# Patient Record
Sex: Female | Born: 1985 | Race: Black or African American | Hispanic: No | Marital: Single | Smoking: Former smoker
Health system: Southern US, Community
[De-identification: ages and names within clinical notes are randomized; demographics above are authoritative.]

## PROBLEM LIST (undated history)

## (undated) DIAGNOSIS — D649 Anemia, unspecified: Secondary | ICD-10-CM

## (undated) DIAGNOSIS — K449 Diaphragmatic hernia without obstruction or gangrene: Secondary | ICD-10-CM

## (undated) DIAGNOSIS — G43909 Migraine, unspecified, not intractable, without status migrainosus: Secondary | ICD-10-CM

## (undated) DIAGNOSIS — F419 Anxiety disorder, unspecified: Secondary | ICD-10-CM

## (undated) HISTORY — PX: EYE SURGERY: SHX253

---

## 2015-05-30 ENCOUNTER — Ambulatory Visit (INDEPENDENT_AMBULATORY_CARE_PROVIDER_SITE_OTHER): Payer: Medicaid Other | Admitting: Licensed Clinical Social Worker

## 2015-05-30 DIAGNOSIS — F39 Unspecified mood [affective] disorder: Secondary | ICD-10-CM
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
## 2015-06-13 ENCOUNTER — Ambulatory Visit: Payer: Medicaid Other | Admitting: Licensed Clinical Social Worker

## 2015-06-17 ENCOUNTER — Ambulatory Visit: Payer: Medicaid Other | Admitting: Licensed Clinical Social Worker

## 2016-01-28 ENCOUNTER — Encounter (HOSPITAL_COMMUNITY): Payer: Self-pay

## 2016-01-28 ENCOUNTER — Emergency Department (HOSPITAL_COMMUNITY)
Admission: EM | Admit: 2016-01-28 | Discharge: 2016-01-29 | Disposition: A | Discharge status: Home / Self Care | Payer: Commercial Managed Care - PPO | Attending: Emergency Medicine | Admitting: Emergency Medicine

## 2016-01-28 DIAGNOSIS — R1011 Right upper quadrant pain: Secondary | ICD-10-CM | POA: Diagnosis not present

## 2016-01-28 DIAGNOSIS — K2970 Gastritis, unspecified, without bleeding: Secondary | ICD-10-CM

## 2016-01-28 DIAGNOSIS — R042 Hemoptysis: Secondary | ICD-10-CM | POA: Diagnosis present

## 2016-01-28 NOTE — ED Notes (Signed)
Pt states she was coughing up small amounts of blood earlier after having an endoscopy, that has stopped but she's complaining of sharp epigastric pain

## 2016-01-29 ENCOUNTER — Emergency Department (HOSPITAL_COMMUNITY): Payer: Commercial Managed Care - PPO

## 2016-01-29 DIAGNOSIS — K2970 Gastritis, unspecified, without bleeding: Secondary | ICD-10-CM | POA: Diagnosis not present

## 2016-01-29 LAB — COMPREHENSIVE METABOLIC PANEL
ALK PHOS: 38 U/L (ref 38–126)
ALT: 16 U/L (ref 14–54)
AST: 16 U/L (ref 15–41)
Albumin: 4.7 g/dL (ref 3.5–5.0)
Anion gap: 5 (ref 5–15)
BUN: 6 mg/dL (ref 6–20)
CALCIUM: 8.8 mg/dL — AB (ref 8.9–10.3)
CO2: 22 mmol/L (ref 22–32)
CREATININE: 0.82 mg/dL (ref 0.44–1.00)
Chloride: 111 mmol/L (ref 101–111)
Glucose, Bld: 88 mg/dL (ref 65–99)
Potassium: 3.7 mmol/L (ref 3.5–5.1)
SODIUM: 138 mmol/L (ref 135–145)
Total Bilirubin: 0.8 mg/dL (ref 0.3–1.2)
Total Protein: 7.8 g/dL (ref 6.5–8.1)

## 2016-01-29 LAB — CBC WITH DIFFERENTIAL/PLATELET
BASOS ABS: 0 10*3/uL (ref 0.0–0.1)
Basophils Relative: 0 %
Eosinophils Absolute: 0.1 10*3/uL (ref 0.0–0.7)
Eosinophils Relative: 3 %
HCT: 37.1 % (ref 36.0–46.0)
HEMOGLOBIN: 12.7 g/dL (ref 12.0–15.0)
LYMPHS ABS: 2.9 10*3/uL (ref 0.7–4.0)
LYMPHS PCT: 52 %
MCH: 31.5 pg (ref 26.0–34.0)
MCHC: 34.2 g/dL (ref 30.0–36.0)
MCV: 92.1 fL (ref 78.0–100.0)
Monocytes Absolute: 0.2 10*3/uL (ref 0.1–1.0)
Monocytes Relative: 4 %
NEUTROS PCT: 41 %
Neutro Abs: 2.3 10*3/uL (ref 1.7–7.7)
Platelets: 264 10*3/uL (ref 150–400)
RBC: 4.03 MIL/uL (ref 3.87–5.11)
RDW: 12.2 % (ref 11.5–15.5)
WBC: 5.6 10*3/uL (ref 4.0–10.5)

## 2016-01-29 LAB — I-STAT BETA HCG BLOOD, ED (MC, WL, AP ONLY)

## 2016-01-29 LAB — RAPID URINE DRUG SCREEN, HOSP PERFORMED
Amphetamines: NOT DETECTED
BARBITURATES: NOT DETECTED
BENZODIAZEPINES: NOT DETECTED
COCAINE: NOT DETECTED
Opiates: NOT DETECTED
TETRAHYDROCANNABINOL: NOT DETECTED

## 2016-01-29 LAB — LIPASE, BLOOD: Lipase: 32 U/L (ref 11–51)

## 2016-01-29 MED ORDER — MORPHINE SULFATE (PF) 4 MG/ML IV SOLN
4.0000 mg | Freq: Once | INTRAVENOUS | Status: DC
  Filled 2016-01-29: qty 1

## 2016-01-29 MED ORDER — PANTOPRAZOLE SODIUM 40 MG PO TBEC
40.0000 mg | DELAYED_RELEASE_TABLET | Freq: Once | ORAL | Status: AC
  Administered 2016-01-29: 40 mg via ORAL
  Filled 2016-01-29: qty 1

## 2016-01-29 MED ORDER — ONDANSETRON 4 MG PO TBDP: 4.0000 mg | ORAL_TABLET | Freq: Three times a day (TID) | ORAL | Status: DC | PRN

## 2016-01-29 MED ORDER — ONDANSETRON HCL 4 MG/2ML IJ SOLN
4.0000 mg | Freq: Once | INTRAMUSCULAR | Status: AC
  Administered 2016-01-29: 4 mg via INTRAVENOUS
  Filled 2016-01-29: qty 2

## 2016-01-29 MED ORDER — SUCRALFATE 1 GM/10ML PO SUSP: 1.0000 g | Freq: Three times a day (TID) | ORAL | Status: DC

## 2016-01-29 MED ORDER — PROMETHAZINE HCL 25 MG PO TABS: 25.0000 mg | ORAL_TABLET | Freq: Four times a day (QID) | ORAL | Status: DC | PRN

## 2016-01-29 MED ORDER — HYDROCODONE-ACETAMINOPHEN 5-325 MG PO TABS: 1.0000 | ORAL_TABLET | ORAL | Status: DC | PRN

## 2016-01-29 MED ORDER — SODIUM CHLORIDE 0.9 % IV BOLUS (SEPSIS)
1000.0000 mL | Freq: Once | INTRAVENOUS | Status: AC
  Administered 2016-01-29: 1000 mL via INTRAVENOUS

## 2016-01-29 MED ORDER — GI COCKTAIL ~~LOC~~
30.0000 mL | Freq: Once | ORAL | Status: AC
  Administered 2016-01-29: 30 mL via ORAL
  Filled 2016-01-29: qty 30

## 2016-01-29 NOTE — Discharge Instructions (Signed)
Please avoid NSAIDs such as aspirin, goody powders, Ibuprofen, Advil, Motrin, Aleve, Excedrin as these can worsen gastritis.  Tylenol is safe to take.  Please avoid alcohol, spicy, acidic, greasy, fatty meals.  Please follow up with your GI doctor.  Please take your Omeprazole as prescribed.   Gastritis, Adult Gastritis is soreness and swelling (inflammation) of the lining of the stomach. Gastritis can develop as a sudden onset (acute) or long-term (chronic) condition. If gastritis is not treated, it can lead to stomach bleeding and ulcers. CAUSES  Gastritis occurs when the stomach lining is weak or damaged. Digestive juices from the stomach then inflame the weakened stomach lining. The stomach lining may be weak or damaged due to viral or bacterial infections. One common bacterial infection is the Helicobacter pylori infection. Gastritis can also result from excessive alcohol consumption, taking certain medicines, or having too much acid in the stomach.  SYMPTOMS  In some cases, there are no symptoms. When symptoms are present, they may include:  Pain or a burning sensation in the upper abdomen.  Nausea.  Vomiting.  An uncomfortable feeling of fullness after eating. DIAGNOSIS  Your caregiver may suspect you have gastritis based on your symptoms and a physical exam. To determine the cause of your gastritis, your caregiver may perform the following:  Blood or stool tests to check for the H pylori bacterium.  Gastroscopy. A thin, flexible tube (endoscope) is passed down the esophagus and into the stomach. The endoscope has a light and camera on the end. Your caregiver uses the endoscope to view the inside of the stomach.  Taking a tissue sample (biopsy) from the stomach to examine under a microscope. TREATMENT  Depending on the cause of your gastritis, medicines may be prescribed. If you have a bacterial infection, such as an H pylori infection, antibiotics may be given. If your gastritis is  caused by too much acid in the stomach, H2 blockers or antacids may be given. Your caregiver may recommend that you stop taking aspirin, ibuprofen, or other nonsteroidal anti-inflammatory drugs (NSAIDs). HOME CARE INSTRUCTIONS  Only take over-the-counter or prescription medicines as directed by your caregiver.  If you were given antibiotic medicines, take them as directed. Finish them even if you start to feel better.  Drink enough fluids to keep your urine clear or pale yellow.  Avoid foods and drinks that make your symptoms worse, such as:  Caffeine or alcoholic drinks.  Chocolate.  Peppermint or mint flavorings.  Garlic and onions.  Spicy foods.  Citrus fruits, such as oranges, lemons, or limes.  Tomato-based foods such as sauce, chili, salsa, and pizza.  Fried and fatty foods.  Eat small, frequent meals instead of large meals. SEEK IMMEDIATE MEDICAL CARE IF:   You have black or dark red stools.  You vomit blood or material that looks like coffee grounds.  You are unable to keep fluids down.  Your abdominal pain gets worse.  You have a fever.  You do not feel better after 1 week.  You have any other questions or concerns. MAKE SURE YOU:  Understand these instructions.  Will watch your condition.  Will get help right away if you are not doing well or get worse.   This information is not intended to replace advice given to you by your health care provider. Make sure you discuss any questions you have with your health care provider.   Document Released: 07/14/2001 Document Revised: 01/19/2012 Document Reviewed: 09/02/2011 Elsevier Interactive Patient Education [employer]2016 Elsevier Inc.  Food Choices for Gastroesophageal Reflux Disease, Adult When you have gastroesophageal reflux disease (GERD), the foods you eat and your eating habits are very important. Choosing the right foods can help ease the discomfort of GERD. WHAT GENERAL GUIDELINES DO I NEED TO  FOLLOW?  Choose fruits, vegetables, whole grains, low-fat dairy products, and low-fat meat, fish, and poultry.  Limit fats such as oils, salad dressings, butter, nuts, and avocado.  Keep a food diary to identify foods that cause symptoms.  Avoid foods that cause reflux. These may be different for different people.  Eat frequent small meals instead of three large meals each day.  Eat your meals slowly, in a relaxed setting.  Limit fried foods.  Cook foods using methods other than frying.  Avoid drinking alcohol.  Avoid drinking large amounts of liquids with your meals.  Avoid bending over or lying down until 2-3 hours after eating. WHAT FOODS ARE NOT RECOMMENDED? The following are some foods and drinks that may worsen your symptoms: Vegetables Tomatoes. Tomato juice. Tomato and spaghetti sauce. Chili peppers. Onion and garlic. Horseradish. Fruits Oranges, grapefruit, and lemon (fruit and juice). Meats High-fat meats, fish, and poultry. This includes hot dogs, ribs, ham, sausage, salami, and bacon. Dairy Whole milk and chocolate milk. Sour cream. Cream. Butter. Ice cream. Cream cheese.  Beverages Coffee and tea, with or without caffeine. Carbonated beverages or energy drinks. Condiments Hot sauce. Barbecue sauce.  Sweets/Desserts Chocolate and cocoa. Donuts. Peppermint and spearmint. Fats and Oils High-fat foods, including [place] fries and potato chips. Other Vinegar. Strong spices, such as black pepper, white pepper, red pepper, cayenne, curry powder, cloves, ginger, and chili powder. The items listed above may not be a complete list of foods and beverages to avoid. Contact your dietitian for more information.   This information is not intended to replace advice given to you by your health care provider. Make sure you discuss any questions you have with your health care provider.   Document Released: 07/20/2005 Document Revised: 08/10/2014 Document Reviewed:  05/24/2013 Elsevier Interactive Patient Education [employer]2016 Elsevier Inc.

## 2016-01-29 NOTE — ED Provider Notes (Signed)
By signing my name below, I, Vista Minkobert Ross, attest that this documentation has been prepared under the direction and in the presence of Teyana Pierron N Lewellyn Fultz, DO. Electronically signed, Vista Minkobert Ross, ED Scribe. 01/29/2016. 4:10 AM.  TIME SEEN: 1:08am  CHIEF COMPLAINT: Hemoptysis  HPI: HPI Comments: Ivar DrapeLatoya Pacey is a 30 y.o. female with no significant past medical history who presents to the Emergency Department complaining of intermittent, sharp epigastric pain that started earlier today after an endoscopy procedure. Pt [facility] for an [facility] by Dr. Rocky CraftsJu. Pt reports that she was told that "her intestines were abnormally high in her abdomen" and found a hernia. Pt also complains of a dry cough with streaky blood in the mucous that has resolved. Pt states her pain was exacerbated at home while sitting and watching TV. Pt states she had mild epigastric pain before the endoscopy procedure but has become much worse. Pt also reports intermittent mild nausea for the past month which is why she had the endoscopy. Pt's boyfriend states he called Dr. Gabriel RainwaterJu's after hour number but was unable to reach anyone. Pt denies any Hx of gallbladder problems. Pt denies any chest pain, diarrhea, shortness of breath. No bloody stools or melena. Not on anticoagulation.  ROS: See HPI Constitutional: no fever  Eyes: no drainage  ENT: no runny nose   Cardiovascular:  no chest pain  Resp: no SOB  GI: no vomiting GU: no dysuria Integumentary: no rash  Allergy: no hives  Musculoskeletal: no leg swelling  Neurological: no slurred speech ROS otherwise negative  PAST MEDICAL HISTORY/PAST SURGICAL HISTORY:  History reviewed. No pertinent past medical history.  MEDICATIONS:  Prior to Admission medications   Not on File    ALLERGIES:  Allergies not on file  SOCIAL HISTORY:  Social History  Substance Use Topics  . Smoking status: Never Smoker   . Smokeless tobacco: Not on file  .  Alcohol Use: No    FAMILY HISTORY: History reviewed. No pertinent family history.  EXAM: BP 120/84 mmHg  Pulse 73  Temp(Src) 98.4 F (36.9 C) (Oral)  Resp 16  SpO2 100%  LMP 01/02/2016 CONSTITUTIONAL: Alert and oriented and responds appropriately to questions. Well-appearing; well-nourished, Pt is very anxious HEAD: Normocephalic EYES: Conjunctivae clear, PERRL ENT: normal nose; no rhinorrhea; moist mucous membranes NECK: Supple, no meningismus, no LAD  CARD: RRR; S1 and S2 appreciated; no murmurs, no clicks, no rubs, no gallops RESP: Normal chest excursion without splinting or tachypnea; breath sounds clear and equal bilaterally; no wheezes, no rhonchi, no rales, no hypoxia or respiratory distress, speaking full sentences ABD/GI: Normal bowel sounds; non-distended; soft, tender to palpation through epigastric region and right upper quadrant, no rebound, voluntary guarding, positive murphys sign, no peritoneal sign BACK:  The back appears normal and is non-tender to palpation, there is no CVA tenderness EXT: Normal ROM in all joints; non-tender to palpation; no edema; normal capillary refill; no cyanosis, no calf tenderness or swelling    SKIN: Normal color for age and race; warm; no rash NEURO: Moves all extremities equally, sensation to light touch intact diffusely, cranial nerves II through XII intact PSYCH: The patient's mood and manner are appropriate. Grooming and personal hygiene are appropriate.  MEDICAL DECISION MAKING: Patient here with epigastric pain that she had prior to endoscopy yesterday but has worsened. Abdomen is non-peritoneal. Does have a right upper quadrant tenderness as well with a positive Murphy sign. States she has never been assessed for gallbladder  disease. Will obtain acute abdominal series to evaluate for possible perforation although I feel it is less likely given her exam. Will also obtain right upper quadrant ultrasound. States she was coughing up small  amounts of bloody mucus earlier but this has resolved. No chest pain or shortness of breath. Lungs are clear to auscultation. She is hemostatically stable. Afebrile and nontoxic.  ED PROGRESS: Patient's labs are unremarkable. LFTs, lipase are normal. No leukocytosis. X-ray of her chest and abdomen are also normal. Right upper quadrant ultrasound shows no cholelithiasis, choledocholithiasis or cholecystitis. Patient given GI cocktail, Protonix and Zofran without relief. Have offered her narcotics. We'll discuss with her gastroenterologist on call.   Discussed patient's case with Dr. Rocky CraftsJu.  She states patient had gastritis on her endoscopy and a hiatal hernia. She was prescribed omeprazole. No other acute findings. Agrees with our workup. Will see the patient in her office as scheduled. Patient refusing narcotics. States she once to go home. I do not feel she needs further emergent workup. We'll discharge with Carafate, Zofran, Vicodin. Discussed return precautions. She verbalized understanding and is Trouble with this plan.    Patient now stating the Zofran does not help with her nausea. Asking for something else. We'll discharge with Phenergan. She is still very well-appearing. No vomiting. No hemoptysis. No hypoxia.    At this time, I do not feel there is any life-threatening condition present. I have reviewed and discussed all results (EKG, imaging, lab, urine as appropriate), exam findings with patient. I have reviewed nursing notes and appropriate previous records.  I feel the patient is safe to be discharged home without further emergent workup. Discussed usual and customary return precautions. Patient and family (if present) verbalize understanding and are comfortable with this plan.  Patient will follow-up with their primary care provider. If they do not have a primary care provider, information for follow-up has been provided to them. All questions have been answered.    This chart was scribed  in my presence and reviewed by me personally.   Layla MawKristen N Naftali Carchi, DO 01/29/16 [phone]

## 2016-01-29 NOTE — ED Notes (Signed)
Patient transported to X-ray 

## 2016-04-14 ENCOUNTER — Ambulatory Visit: Payer: Self-pay

## 2016-07-12 ENCOUNTER — Emergency Department (HOSPITAL_BASED_OUTPATIENT_CLINIC_OR_DEPARTMENT_OTHER): Payer: Medicaid Other

## 2016-07-12 ENCOUNTER — Emergency Department (HOSPITAL_BASED_OUTPATIENT_CLINIC_OR_DEPARTMENT_OTHER)
Admission: EM | Admit: 2016-07-12 | Discharge: 2016-07-13 | Disposition: A | Discharge status: Home / Self Care | Payer: Medicaid Other | Attending: Emergency Medicine | Admitting: Emergency Medicine

## 2016-07-12 ENCOUNTER — Encounter (HOSPITAL_BASED_OUTPATIENT_CLINIC_OR_DEPARTMENT_OTHER): Payer: Self-pay | Admitting: *Deleted

## 2016-07-12 DIAGNOSIS — A084 Viral intestinal infection, unspecified: Secondary | ICD-10-CM | POA: Diagnosis not present

## 2016-07-12 DIAGNOSIS — R509 Fever, unspecified: Secondary | ICD-10-CM | POA: Diagnosis present

## 2016-07-12 DIAGNOSIS — Z91040 Latex allergy status: Secondary | ICD-10-CM | POA: Insufficient documentation

## 2016-07-12 DIAGNOSIS — F17200 Nicotine dependence, unspecified, uncomplicated: Secondary | ICD-10-CM | POA: Insufficient documentation

## 2016-07-12 HISTORY — DX: Diaphragmatic hernia without obstruction or gangrene: K44.9

## 2016-07-12 HISTORY — DX: Anemia, unspecified: D64.9

## 2016-07-12 LAB — URINALYSIS, ROUTINE W REFLEX MICROSCOPIC
Glucose, UA: NEGATIVE mg/dL
HGB URINE DIPSTICK: NEGATIVE
KETONES UR: 15 mg/dL — AB
Leukocytes, UA: NEGATIVE
NITRITE: NEGATIVE
Protein, ur: NEGATIVE mg/dL
SPECIFIC GRAVITY, URINE: 1.028 (ref 1.005–1.030)
pH: 7.5 (ref 5.0–8.0)

## 2016-07-12 LAB — I-STAT CG4 LACTIC ACID, ED: LACTIC ACID, VENOUS: 1.51 mmol/L (ref 0.5–1.9)

## 2016-07-12 LAB — CBC WITH DIFFERENTIAL/PLATELET
BASOS PCT: 0 %
Basophils Absolute: 0 10*3/uL (ref 0.0–0.1)
EOS ABS: 0.2 10*3/uL (ref 0.0–0.7)
Eosinophils Relative: 2 %
HEMATOCRIT: 38.9 % (ref 36.0–46.0)
HEMOGLOBIN: 12.9 g/dL (ref 12.0–15.0)
LYMPHS ABS: 2.2 10*3/uL (ref 0.7–4.0)
Lymphocytes Relative: 31 %
MCH: 31.6 pg (ref 26.0–34.0)
MCHC: 33.2 g/dL (ref 30.0–36.0)
MCV: 95.3 fL (ref 78.0–100.0)
Monocytes Absolute: 0.4 10*3/uL (ref 0.1–1.0)
Monocytes Relative: 5 %
NEUTROS ABS: 4.3 10*3/uL (ref 1.7–7.7)
NEUTROS PCT: 62 %
Platelets: 273 10*3/uL (ref 150–400)
RBC: 4.08 MIL/uL (ref 3.87–5.11)
RDW: 12.3 % (ref 11.5–15.5)
WBC: 7.1 10*3/uL (ref 4.0–10.5)

## 2016-07-12 LAB — COMPREHENSIVE METABOLIC PANEL
ALT: 21 U/L (ref 14–54)
ANION GAP: 8 (ref 5–15)
AST: 22 U/L (ref 15–41)
Albumin: 4.2 g/dL (ref 3.5–5.0)
Alkaline Phosphatase: 45 U/L (ref 38–126)
BUN: 11 mg/dL (ref 6–20)
CALCIUM: 9.5 mg/dL (ref 8.9–10.3)
CHLORIDE: 108 mmol/L (ref 101–111)
CO2: 23 mmol/L (ref 22–32)
CREATININE: 0.89 mg/dL (ref 0.44–1.00)
Glucose, Bld: 97 mg/dL (ref 65–99)
Potassium: 3.5 mmol/L (ref 3.5–5.1)
SODIUM: 139 mmol/L (ref 135–145)
Total Bilirubin: 1 mg/dL (ref 0.3–1.2)
Total Protein: 8.3 g/dL — ABNORMAL HIGH (ref 6.5–8.1)

## 2016-07-12 LAB — PREGNANCY, URINE: PREG TEST UR: NEGATIVE

## 2016-07-12 MED ORDER — SODIUM CHLORIDE 0.9 % IV BOLUS (SEPSIS)
1000.0000 mL | Freq: Once | INTRAVENOUS | Status: AC
  Administered 2016-07-12: 1000 mL via INTRAVENOUS

## 2016-07-12 MED ORDER — IBUPROFEN 400 MG PO TABS
600.0000 mg | ORAL_TABLET | Freq: Once | ORAL | Status: AC
  Administered 2016-07-12: 600 mg via ORAL
  Filled 2016-07-12: qty 1

## 2016-07-12 MED ORDER — ONDANSETRON HCL 4 MG/2ML IJ SOLN: 4.0000 mg | Freq: Once | INTRAMUSCULAR | Status: DC

## 2016-07-12 MED ORDER — LOPERAMIDE HCL 2 MG PO CAPS
4.0000 mg | ORAL_CAPSULE | Freq: Once | ORAL | Status: AC
  Administered 2016-07-12: 4 mg via ORAL
  Filled 2016-07-12: qty 2

## 2016-07-12 MED ORDER — PROMETHAZINE HCL 25 MG/ML IJ SOLN
12.5000 mg | Freq: Once | INTRAMUSCULAR | Status: AC
  Administered 2016-07-12: 12.5 mg via INTRAVENOUS
  Filled 2016-07-12: qty 1

## 2016-07-12 NOTE — Discharge Instructions (Signed)
This is likely a viral gi infection. Please take the phenergan as needed for nausea. You can also take imodium for diarrhea. Please continue to drink plenty of fluids. Follow up with a primary care doctor. Return to the ED if your symptoms worsen. Clear liquid diet and advance diet as tolerated. Recommend bland diet.

## 2016-07-12 NOTE — ED Triage Notes (Signed)
C/o fever, nv, (diarrhea resolved), generalized body aches, and dizziness, also mentions urine getting darker and some back pain, sx onset today, last tylenol at 2000. (denies: cough, congestion, abd pain, urinary or vaginal sx, cold sx, sore throat or ear ache). Alert, NAD, calm, interactive, no dyspnea noted.

## 2016-07-12 NOTE — ED Notes (Signed)
Now mentions, recent fall after getting dizzy while getting out of the tub, miscalculated distance and fell, denies injury, also mentions L sided intermittant HA.

## 2016-07-12 NOTE — ED Provider Notes (Signed)
[facility]   CSN: [id number] Arrival date & time: 07/12/16  2038  By signing my name below, I, Rosario AdieWilliam Andrew Bradley, attest that this documentation has been prepared under the direction and in the presence of Demetrios LollKenneth Leaphart, PA-C.  Electronically Signed: Rosario AdieWilliam Andrew Bradley, ED Scribe. 07/12/16. 9:14 PM.  History   Chief Complaint Chief Complaint  Patient presents with  . Fever   The history is provided by the patient. No language interpreter was used.   HPI Comments: Crystal DrapeLatoya Bradley is a 30 y.o. female with a PMHx of anemia, who presents to the Emergency Department complaining of gradual onset, waxing and waning fever (Tmax 102.2) onset earlier this morning. She reports associated diffuse myalgias, bilateral lower back pain, nausea, vomiting, fatigue, and diarrhea secondary to her fever. Pt additionally notes that she experienced an episode of dizziness/light-headedness tonight while she was getting out of her shower, prompting her to come into the ED. She has had decreased fluid and food intake since the onset of her symptoms. Pt has been taking Tylenol (last dosage one hour ago) at home with moderate relief of her symptoms. 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 She additionally notes that one of her coworkers at work have recently been sick with similar symptoms. No recent suspicious food intake or travel outside of the country. Denies rhinorrhea, sore throat, cough, urgency, frequency, hematuria, dysuria, difficulty urinating, abdominal pain, bloody stools, melena, vaginal bleeding, vaginal discharge, ear pain, congestion, or any other associated symptoms.   Past Medical History:  Diagnosis Date  . Anemia   . Hiatal hernia    There are no active problems to display for this patient.  Past Surgical History:  Procedure Laterality Date  . EYE SURGERY     OB History    No data available      Home Medications    Prior to Admission medications   Medication Sig Start Date End Date Taking? Authorizing Provider  HYDROcodone-acetaminophen (NORCO/VICODIN) 5-325 MG tablet Take 1 tablet by mouth every 4 (four) hours as needed. 01/29/16   Kristen N Ward, DO  Omeprazole 20 MG TBEC Take 20 mg by mouth daily. 01/20/16 02/19/16  Historical Provider, MD  ondansetron (ZOFRAN ODT) 4 MG disintegrating tablet Take 1 tablet (4 mg total) by mouth every 8 (eight) hours as needed for nausea or vomiting. 01/29/16   Layla MawKristen N Ward, DO  promethazine (PHENERGAN) 25 MG tablet Take 1 tablet (25 mg total) by mouth every 6 (six) hours as needed for nausea or vomiting. 01/29/16   Kristen N Ward, DO  sucralfate (CARAFATE) 1 GM/10ML suspension Take 10 mLs (1 g total) by mouth 4 (four) times daily -  with meals and at bedtime. 01/29/16   Layla MawKristen N Ward, DO   Family History History reviewed. No pertinent family history.  Social History Social History  Substance Use Topics  . Smoking status: Current Every Day Smoker  . Smokeless tobacco: Never Used  . Alcohol use Yes     Comment: social   Allergies   Latex; Shellfish-derived products; Ativan [lorazepam]; and Hydroxyzine  Review of Systems Review of Systems  Constitutional: Positive for appetite change, fatigue and fever (Tmax 102.2).  HENT: Negative for congestion, ear pain and sore throat.   Respiratory: Negative for cough.   Gastrointestinal: Positive for diarrhea, nausea and vomiting. Negative for abdominal pain and blood in stool.  Genitourinary: Negative for difficulty urinating, dysuria, frequency,  hematuria, urgency, vaginal bleeding and vaginal discharge.  Musculoskeletal: Positive for back pain (lower) and myalgias (diffuse).  Neurological: Positive for dizziness and light-headedness.  All other systems reviewed and are negative.  Physical Exam Updated Vital Signs BP 105/67 (BP Location: Right Arm)   Pulse 77   Temp 98.7 F (37.1 C) (Oral)    Resp 16   Ht 5\' 9"  (1.753 m)   Wt 89.8 kg   LMP 05/12/2016 Comment: was on depo, last injection August   SpO2 100%   BMI 29.24 kg/m   Physical Exam  Constitutional: She is oriented to person, place, and time. She appears well-developed and well-nourished. No distress.  HENT:  Head: Normocephalic and atraumatic.  Mouth/Throat: Uvula is midline. Mucous membranes are dry. No posterior oropharyngeal edema or posterior oropharyngeal erythema.  Eyes: Conjunctivae and EOM are normal. Pupils are equal, round, and reactive to light.  Neck: Normal range of motion. Neck supple.  Cardiovascular: Normal rate, regular rhythm, normal heart sounds and intact distal pulses.  Exam reveals no gallop and no friction rub.   No murmur heard. Tachycardic on in triage however on my exam hr was 90.  Pulmonary/Chest: Effort normal and breath sounds normal. No respiratory distress.  Abdominal: Soft. Bowel sounds are normal. She exhibits no distension. There is no tenderness. There is no rebound and no guarding.  Musculoskeletal: Normal range of motion.  Moving all four extremities without any difficulties.  Lymphadenopathy:    She has no cervical adenopathy.  Neurological: She is alert and oriented to person, place, and time.  The patient is alert, attentive, and oriented x 3. Speech is clear. Cranial nerve II-VII grossly intact. Negative pronator drift. Sensation intact. Strength 5/5 in all extremities. Reflexes 2+ and symmetric at biceps, triceps, knees, and ankles. Rapid alternating movement and fine finger movements intact. Romberg is absent. Posture and gait normal.   Skin: Skin is warm and dry. Capillary refill takes less than 2 seconds. No pallor.  Psychiatric: She has a normal mood and affect. Her behavior is normal.  Nursing note and vitals reviewed.  ED Treatments / Results  DIAGNOSTIC STUDIES: Oxygen Saturation is 98% on RA, normal by my interpretation.   COORDINATION OF CARE: 9:14  PM-Discussed next steps with pt. Pt verbalized understanding and is agreeable with the plan.   Labs (all labs ordered are listed, but only abnormal results are displayed) Labs Reviewed  URINALYSIS, ROUTINE W REFLEX MICROSCOPIC - Abnormal; Notable for the following:       Result Value   Color, Urine AMBER (*)    Bilirubin Urine SMALL (*)    Ketones, ur 15 (*)    All other components within normal limits  COMPREHENSIVE METABOLIC PANEL - Abnormal; Notable for the following:    Total Protein 8.3 (*)    All other components within normal limits  PREGNANCY, URINE  CBC WITH DIFFERENTIAL/PLATELET  I-STAT CG4 LACTIC ACID, ED   EKG  EKG Interpretation None      Radiology No results found.  Procedures Procedures  Medications Ordered in ED Medications  sodium chloride 0.9 % bolus 1,000 mL (0 mLs Intravenous Stopped 07/12/16 2258)  promethazine (PHENERGAN) injection 12.5 mg (12.5 mg Intravenous Given 07/12/16 2144)  sodium chloride 0.9 % bolus 1,000 mL (0 mLs Intravenous Stopped 07/13/16 0004)  ibuprofen (ADVIL,MOTRIN) tablet 600 mg (600 mg Oral Given 07/12/16 2249)  loperamide (IMODIUM) capsule 4 mg (4 mg Oral Given 07/12/16 2329)    Initial Impression / Assessment and Plan / ED  Course  I have reviewed the triage vital signs and the nursing notes.  Pertinent labs & imaging results that were available during my care of the patient were reviewed by me and considered in my medical decision making (see chart for details).  Clinical Course   Patient with symptoms consistent with viral gastroenteritis.  Vitals are stable, no fever.  Dehydration treated with fluids. Tolerating PO fluids > 6 oz.  Lungs are clear.  No focal abdominal pain, no concern for appendicitis, cholecystitis, pancreatitis, ruptured viscus, UTI, kidney stone, or any other abdominal etiology.  Supportive therapy indicated with return if symptoms worsen. Pt is hemodynamically stable, in NAD, & able to ambulate in the ED.  Pt has no complaints prior to dc. Pt is comfortable with above plan and is stable for discharge at this time. All questions were answered prior to disposition. Strict return precautions for f/u to the ED were discussed.   Final Clinical Impressions(s) / ED Diagnoses   Final diagnoses:  Viral gastroenteritis   New Prescriptions Discharge Medication List as of 07/12/2016 11:34 PM     I personally performed the services described in this documentation, which was scribed in my presence. The recorded information has been reviewed and is accurate.     Rise Mu, PA-C 07/14/16 2255    Linwood Dibbles, MD 07/15/16 [phone]

## 2016-07-13 MED ORDER — PROMETHAZINE HCL 25 MG PO TABS: 25.0000 mg | ORAL_TABLET | Freq: Four times a day (QID) | ORAL | 0 refills | Status: DC | PRN

## 2017-02-24 ENCOUNTER — Ambulatory Visit (HOSPITAL_COMMUNITY)
Admission: EM | Admit: 2017-02-24 | Discharge: 2017-02-24 | Disposition: A | Discharge status: Home / Self Care | Payer: Commercial Managed Care - PPO | Attending: Family Medicine | Admitting: Family Medicine

## 2017-02-24 ENCOUNTER — Encounter (HOSPITAL_COMMUNITY): Payer: Self-pay | Admitting: Emergency Medicine

## 2017-02-24 DIAGNOSIS — Z3202 Encounter for pregnancy test, result negative: Secondary | ICD-10-CM

## 2017-02-24 DIAGNOSIS — R300 Dysuria: Secondary | ICD-10-CM

## 2017-02-24 DIAGNOSIS — N3001 Acute cystitis with hematuria: Secondary | ICD-10-CM

## 2017-02-24 LAB — POCT URINALYSIS DIP (DEVICE)
Glucose, UA: NEGATIVE mg/dL
Ketones, ur: NEGATIVE mg/dL
NITRITE: POSITIVE — AB
PH: 7.5 (ref 5.0–8.0)
Specific Gravity, Urine: 1.02 (ref 1.005–1.030)
UROBILINOGEN UA: 2 mg/dL — AB (ref 0.0–1.0)

## 2017-02-24 LAB — POCT PREGNANCY, URINE: PREG TEST UR: NEGATIVE

## 2017-02-24 MED ORDER — SULFAMETHOXAZOLE-TRIMETHOPRIM 800-160 MG PO TABS: 1.0000 | ORAL_TABLET | Freq: Two times a day (BID) | ORAL | 0 refills | Status: AC

## 2017-02-24 NOTE — ED Provider Notes (Signed)
  [facility]    ASSESSMENT & PLAN:  Today you were diagnosed with the following: 1. Acute cystitis with hematuria    You have been prescribed prescription medications this visit.   Meds ordered this encounter  Medications  . sulfamethoxazole-trimethoprim (BACTRIM DS,SEPTRA DS) 800-160 MG tablet    Sig: Take 1 tablet by mouth 2 (two) times daily.    Dispense:  10 tablet    Refill:  0   We have send a urine culture and will contact you when the results are available.  You may use over the counter Pyridium if needed for any discomfort associated with urination. Please do your best to ensure adequate fluid intake.  If you are not improving over the next few days or feel you are worsening please follow up here or the Emergency Department if you are unable to see your regular doctor.  Outlined signs and symptoms indicating need for more acute intervention. Call or return to clinic if these symptoms worsen or fail to improve as anticipated. Patient verbalized understanding. After Visit Summary given.  SUBJECTIVE:  Crystal Bradley is a 31 y.o. female who complains of urinary frequency, urgency and dysuria for the past 2 days, without fever, chills, abnormal vaginal discharge or bleeding. Does report mild bilateral flank discomfort and positive hematuria today. Normal PO intake. No abdominal pain. No self treatment. Patient's last menstrual period was 02/12/2017 (exact date).  OBJECTIVE:  Vitals:   02/24/17 1115  BP: 128/77  Pulse: 69  Resp: 16  Temp: 98.5 F (36.9 C)  TempSrc: Oral  SpO2: 100%   Appears well, in no apparent distress. Abdomen is soft without tenderness, guarding, mass, rebound or organomegaly. No CVA tenderness or inguinal adenopathy noted.  Labs Reviewed  POCT URINALYSIS DIP (DEVICE) - Abnormal; Notable for the following:       Result Value   Bilirubin Urine SMALL (*)    Hgb urine dipstick LARGE (*)    Protein, ur >=300 (*)    Urobilinogen, UA  2.0 (*)    Nitrite POSITIVE (*)    Leukocytes, UA SMALL (*)    All other components within normal limits  URINE CULTURE  POCT PREGNANCY, URINE         Mardella LaymanHagler, Daryana Whirley, MD 02/24/17 1355

## 2017-02-24 NOTE — ED Triage Notes (Signed)
[facility] a [facility] of lower back, abdominal pain and urinary frequency with hematuria x 2 days.

## 2017-04-16 ENCOUNTER — Ambulatory Visit (INDEPENDENT_AMBULATORY_CARE_PROVIDER_SITE_OTHER): Payer: Self-pay | Admitting: Family Medicine

## 2017-04-16 ENCOUNTER — Encounter: Payer: Self-pay | Admitting: Family Medicine

## 2017-04-16 VITALS — BP 98/68 | HR 74 | Temp 98.3°F | Ht 69.0 in | Wt 210.0 lb

## 2017-04-16 DIAGNOSIS — G43009 Migraine without aura, not intractable, without status migrainosus: Secondary | ICD-10-CM

## 2017-04-16 DIAGNOSIS — F411 Generalized anxiety disorder: Secondary | ICD-10-CM

## 2017-04-16 MED ORDER — VENLAFAXINE HCL ER 37.5 MG PO CP24: 37.5000 mg | ORAL_CAPSULE | Freq: Every day | ORAL | 2 refills | Status: DC

## 2017-04-16 MED ORDER — KETOROLAC TROMETHAMINE 60 MG/2ML IM SOLN
60.0000 mg | Freq: Once | INTRAMUSCULAR | Status: AC
  Administered 2017-04-16: 60 mg via INTRAMUSCULAR

## 2017-04-16 MED ORDER — NAPROXEN 500 MG PO TBEC
DELAYED_RELEASE_TABLET | ORAL | 2 refills | Status: DC
Start: 2017-04-16 — End: 2018-10-27

## 2017-04-16 MED ORDER — TRAZODONE HCL 50 MG PO TABS: 25.0000 mg | ORAL_TABLET | Freq: Every evening | ORAL | 1 refills | Status: DC | PRN

## 2017-04-16 MED ORDER — SUMATRIPTAN SUCCINATE 100 MG PO TABS: 100.0000 mg | ORAL_TABLET | ORAL | 2 refills | Status: DC | PRN

## 2017-04-16 MED ORDER — NAPROXEN 500 MG PO TBEC: DELAYED_RELEASE_TABLET | ORAL | 2 refills | Status: DC

## 2017-04-16 NOTE — Progress Notes (Signed)
Chief Complaint  Patient presents with  . Headache    Wynell Halberg is a 31 y.o. female here for evaluation of R sided headache.  Duration: 2 weeks Palliation: None Provocation: None Severity: 4 Quality: achy Associated symptoms: none Denies: nausea, vomiting, sonophobia, photophobia, diplopia, ataxia, neck stiffness Currently with headache? Yes Failed therapies: none  Poor sleep. Has been on Ambien, Effexor, Prozac. Believes that anxiety is contributing. Currently on Xanax prn, this is not helping her sleep. She does follow with a therapist.  Past Medical History:  Diagnosis Date  . Anemia   . Hiatal hernia    No family history on file.   Current Meds  Medication Sig  . ALPRAZolam (XANAX) 0.5 MG tablet Take 0.5 mg by mouth 3 (three) times daily as needed for anxiety.   BP 98/68 (BP Location: Left Arm, Patient Position: Sitting, Cuff Size: Large)   Pulse 74   Temp 98.3 F (36.8 C) (Oral)   Ht  (1.753 m)   Wt 210 lb (95.3 kg)   SpO2 97%   BMI 31.01 kg/m  General: awake, alert, appearing stated age Eyes: PERRLA, EOMi Heart: RRR, no murmurs, no bruits Lungs: CTAB, no accessory muscle use Neuro: CN 2-12 intact, no cerebellar signs, DTR's equal and symmetry, no clonus MSK: 5/5 strength throughout, normal gait, no TTP over posterior cervical triangle or paraspinal cervical musculature Psych: Age appropriate judgment and insight, mood and affect normal  GAD (generalized anxiety disorder) - Plan: traZODone (DESYREL) 50 MG tablet, venlafaxine XR (EFFEXOR-XR) 37.5 MG 24 hr capsule  Migraine without aura and without status migrainosus, not intractable - Plan: ketorolac (TORADOL) injection 60 mg, naproxen (EC NAPROSYN) 500 MG EC tablet, SUMAtriptan (IMITREX) 100 MG tablet  Toradol today to break HA, no NSAIDs for rest of day. Refill Imitrex. # for CBT here given. Trazodone prn. Sleep hygiene info also given. Follow up in 4 weeks with reg PCP. The patient voiced  understanding and agreement to the plan.  Jilda Roche [place], [place] 11:39 AM 04/16/17

## 2017-04-16 NOTE — Patient Instructions (Addendum)
Please consider cognitive behavioral therapy. Contact [phone] to schedule an appointment or inquire about cost/insurance coverage.   Sleep Hygiene Tips:  Do not watch TV or look at screens within 1 hour of going to bed. If you do, make sure there is a blue light filter (nighttime mode) involved.  Try to go to bed around the same time every night. Wake up at the same time within 1 hour of regular time. Ex: If you wake up at 7 AM for work, do not sleep past 8 AM on days that you don't work.  Do not drink alcohol before bedtime.  Do not consume caffeine-containing beverages after noon or within 9 hours of intended bedtime.  Get regular exercise/physical activity in your life, but not within 2 hours of planned bedtime.  Do not take naps.   Do not eat within 2 hours of planned bedtime.  Melatonin, 3-5 mg 30-60 minutes before planned bedtime may be helpful.   The bed should be for sleep or sex only. If after 20-30 minutes you are unable to fall asleep, get up and do something relaxing. Do this until you feel ready to go to sleep again.   Follow up in 4-6 weeks with your PCP (or whomever you choose) to recheck how things are going on your new medicine.

## 2017-04-20 ENCOUNTER — Encounter (HOSPITAL_BASED_OUTPATIENT_CLINIC_OR_DEPARTMENT_OTHER): Payer: Self-pay | Admitting: *Deleted

## 2017-04-20 ENCOUNTER — Emergency Department (HOSPITAL_BASED_OUTPATIENT_CLINIC_OR_DEPARTMENT_OTHER)
Admission: EM | Admit: 2017-04-20 | Discharge: 2017-04-20 | Disposition: A | Discharge status: Home / Self Care | Payer: Self-pay | Attending: Emergency Medicine | Admitting: Emergency Medicine

## 2017-04-20 ENCOUNTER — Emergency Department (HOSPITAL_BASED_OUTPATIENT_CLINIC_OR_DEPARTMENT_OTHER): Payer: Self-pay

## 2017-04-20 DIAGNOSIS — Z79899 Other long term (current) drug therapy: Secondary | ICD-10-CM | POA: Insufficient documentation

## 2017-04-20 DIAGNOSIS — F17210 Nicotine dependence, cigarettes, uncomplicated: Secondary | ICD-10-CM | POA: Insufficient documentation

## 2017-04-20 DIAGNOSIS — R079 Chest pain, unspecified: Secondary | ICD-10-CM

## 2017-04-20 DIAGNOSIS — I300 Acute nonspecific idiopathic pericarditis: Secondary | ICD-10-CM | POA: Insufficient documentation

## 2017-04-20 HISTORY — DX: Anxiety disorder, unspecified: F41.9

## 2017-04-20 LAB — COMPREHENSIVE METABOLIC PANEL
ALBUMIN: 3.9 g/dL (ref 3.5–5.0)
ALT: 15 U/L (ref 14–54)
AST: 19 U/L (ref 15–41)
Alkaline Phosphatase: 33 U/L — ABNORMAL LOW (ref 38–126)
Anion gap: 5 (ref 5–15)
BUN: 8 mg/dL (ref 6–20)
CHLORIDE: 105 mmol/L (ref 101–111)
CO2: 25 mmol/L (ref 22–32)
Calcium: 8.8 mg/dL — ABNORMAL LOW (ref 8.9–10.3)
Creatinine, Ser: 0.76 mg/dL (ref 0.44–1.00)
GFR calc Af Amer: >60 mL/min (ref >60)
GFR calc non Af Amer: >60 mL/min (ref >60)
GLUCOSE: 93 mg/dL (ref 65–99)
POTASSIUM: 4.2 mmol/L (ref 3.5–5.1)
Sodium: 135 mmol/L (ref 135–145)
Total Bilirubin: 0.4 mg/dL (ref 0.3–1.2)
Total Protein: 7.5 g/dL (ref 6.5–8.1)

## 2017-04-20 LAB — TROPONIN I
Troponin I: <0.03 ng/mL (ref <0.03)
Troponin I: <0.03 ng/mL (ref <0.03)
Troponin I: <0.03 ng/mL (ref <0.03)

## 2017-04-20 LAB — CBC WITH DIFFERENTIAL/PLATELET
BASOS ABS: 0 10*3/uL (ref 0.0–0.1)
BASOS PCT: 0 %
EOS PCT: 3 %
Eosinophils Absolute: 0.2 10*3/uL (ref 0.0–0.7)
HCT: 35.4 % — ABNORMAL LOW (ref 36.0–46.0)
Hemoglobin: 11.7 g/dL — ABNORMAL LOW (ref 12.0–15.0)
Lymphocytes Relative: 40 %
Lymphs Abs: 2.6 10*3/uL (ref 0.7–4.0)
MCH: 31.5 pg (ref 26.0–34.0)
MCHC: 33.1 g/dL (ref 30.0–36.0)
MCV: 95.2 fL (ref 78.0–100.0)
MONO ABS: 0.4 10*3/uL (ref 0.1–1.0)
Monocytes Relative: 6 %
NEUTROS ABS: 3.2 10*3/uL (ref 1.7–7.7)
Neutrophils Relative %: 51 %
PLATELETS: 226 10*3/uL (ref 150–400)
RBC: 3.72 MIL/uL — ABNORMAL LOW (ref 3.87–5.11)
RDW: 11.7 % (ref 11.5–15.5)
WBC: 6.4 10*3/uL (ref 4.0–10.5)

## 2017-04-20 LAB — LIPASE, BLOOD: Lipase: 31 U/L (ref 11–51)

## 2017-04-20 MED ORDER — IBUPROFEN 200 MG PO TABS
600.0000 mg | ORAL_TABLET | Freq: Once | ORAL | Status: AC
  Administered 2017-04-20: 600 mg via ORAL
  Filled 2017-04-20: qty 1

## 2017-04-20 MED ORDER — IBUPROFEN 200 MG PO TABS: ORAL_TABLET | ORAL | Status: DC

## 2017-04-20 NOTE — ED Provider Notes (Signed)
[facility]   CSN: [id number] Arrival date & time: 04/20/17  1746     History   Chief Complaint Chief Complaint  Patient presents with  . Chest Pain    HPI Crystal Bradley is a 31 y.o. female.  The history is provided by the patient.  Chest Pain   This is a new problem. Episode onset: several weeks. Episode frequency: intermittently. The problem has been gradually worsening. The pain is associated with rest. The pain is present in the substernal region. The pain is moderate. Quality: tightness. Radiates to: maybe right jaw pain once yesterday; otherwise nonradiating. Episode Length: seconds to minute; today been there hours. Exacerbated by: lying down makes it worse. Associated symptoms include palpitations. Pertinent negatives include no cough, no fever, no lower extremity edema, no malaise/fatigue, no nausea, no orthopnea, no shortness of breath and no vomiting. Treatments tried: xanax - not helping. Risk factors include smoking/tobacco exposure.  Pertinent negatives for past medical history include no CAD, no COPD, no diabetes, no DVT, no hyperlipidemia, no hypertension, no MI, no PE and no TIA.  Pertinent negatives for family medical history include: no early MI.  Procedure history is negative for cardiac catheterization and exercise treadmill test.   No OCPs, travel, surgery. No recent fevers or infections.  Reports h/o Pericarditis.  Past Medical History:  Diagnosis Date  . Anxiety     There are no active problems to display for this patient.   Past Surgical History:  Procedure Laterality Date  . EYE SURGERY      OB History    No data available       Home Medications    Prior to Admission medications   Medication Sig Start Date End Date Taking? Authorizing Provider  ALPRAZolam Prudy Feeler) 0.5 MG tablet Take 0.5 mg by mouth at bedtime as needed for anxiety.   Yes [provider]  venlafaxine XR (EFFEXOR-XR) 37.5 MG 24 hr capsule  Take 37.5 mg by mouth daily with breakfast.   Yes [provider]  ibuprofen (ADVIL,MOTRIN) 200 MG tablet Take 600 mg every 8 hours for 14 days. On day 15 start taking 400 mg every 8 hours for one week, followed by 200 mg every 8 hours on week four. 04/20/17   Nira Conn, MD    Family History No family history on file.  Social History Social History  Substance Use Topics  . Smoking status: Current Every Day Smoker    Packs/day: 0.50    Types: Cigarettes  . Smokeless tobacco: Not on file  . Alcohol use No     Allergies   Latex   Review of Systems Review of Systems  Constitutional: Negative for fever and malaise/fatigue.  Respiratory: Negative for cough and shortness of breath.   Cardiovascular: Positive for chest pain and palpitations. Negative for orthopnea.  Gastrointestinal: Negative for nausea and vomiting.   All other systems are reviewed and are negative for acute change except as noted in the HPI   Physical Exam Updated Vital Signs BP 112/71 (BP Location: Right Arm)   Pulse 62   Temp 98.3 F (36.8 C) (Oral)   Resp 13   Ht  (1.753 m)   Wt 93.9 kg (207 lb)   LMP 03/30/2017   SpO2 96%   BMI 30.57 kg/m   Physical Exam  Constitutional: She is oriented to person, place, and time. She appears well-developed and well-nourished. No distress.  [id number]: Normocephalic and atraumatic.  Nose:  Nose normal.  Eyes: Pupils are equal, round, and reactive to light. Conjunctivae and EOM are normal. Right eye exhibits no discharge. Left eye exhibits no discharge. No scleral icterus.  Neck: Normal range of motion. Neck supple.  Cardiovascular: Normal rate and regular rhythm.  Exam reveals no gallop and no friction rub.   No murmur heard. Pulmonary/Chest: Effort normal and breath sounds normal. No stridor. No respiratory distress. She has no rales.  Abdominal: Soft. She exhibits no distension. There is no tenderness.  Musculoskeletal: She exhibits  no edema or tenderness.  Neurological: She is alert and oriented to person, place, and time.  Skin: Skin is warm and dry. No rash noted. She is not diaphoretic. No erythema.  Psychiatric: She has a normal mood and affect.  Vitals reviewed.    ED Treatments / Results  Labs (all labs ordered are listed, but only abnormal results are displayed) Labs Reviewed  CBC WITH DIFFERENTIAL/PLATELET - Abnormal; Notable for the following:       Result Value   RBC 3.72 (*)    Hemoglobin 11.7 (*)    HCT 35.4 (*)    All other components within normal limits  COMPREHENSIVE METABOLIC PANEL - Abnormal; Notable for the following:    Calcium 8.8 (*)    Alkaline Phosphatase 33 (*)    All other components within normal limits  LIPASE, BLOOD  TROPONIN I  TROPONIN I  TROPONIN I    EKG  EKG Interpretation  Date/Time:  Tuesday April 20 2017 17:57:04 EDT Ventricular Rate:  69 PR Interval:  160 QRS Duration: 78 QT Interval:  390 QTC Calculation: 417 R Axis:   59 Text Interpretation:  Normal sinus rhythm Normal ECG NO STEMI No old tracing to compare Confirmed by Drema Pry [phone]) on 04/20/2017 6:41:56 PM       Radiology Dg Chest 2 View  Result Date: 04/20/2017 CLINICAL DATA:  Chest pain for several days. EXAM: CHEST  2 VIEW COMPARISON:  None. FINDINGS: Minimal streaky density at the bases. There is no edema, consolidation, effusion, or pneumothorax. Normal heart size and mediastinal contours. Artifact from EKG pads. IMPRESSION: Mild atelectatic type changes.  Otherwise negative. Electronically Signed   By: Marnee Spring M.D.   On: 04/20/2017 20:16    Procedures Procedures (including critical care time)   EMERGENCY DEPARTMENT [place] CARDIAC EXAM "Study: Limited Ultrasound of the Heart and Pericardium"  INDICATIONS:Chest pain Multiple views of the heart and pericardium were obtained in real-time with a multi-frequency probe.  PERFORMED UE:AVWUJW IMAGES ARCHIVED?: Yes LIMITATIONS:   None VIEWS USED: Subcostal 4 chamber, Parasternal long axis and Parasternal short axis INTERPRETATION: Cardiac activity present, Pericardial effusioin absent, Cardiac tamponade absent and Normal contractility   Medications Ordered in ED Medications  ibuprofen (ADVIL,MOTRIN) tablet 600 mg (600 mg Oral Given 04/20/17 2056)     Initial Impression / Assessment and Plan / ED Course  I have reviewed the triage vital signs and the nursing notes.  Pertinent labs & imaging results that were available during my care of the patient were reviewed by me and considered in my medical decision making (see chart for details).     Atypical chest pain positional in nature, worse with lying down. Concerning for pericarditis. Patient reports having prior history of pericarditis 5 or 6 years ago of unknown etiology. She denies any recent fevers or infections. Denies any autoimmune disorders. EKG concerning for possible pericarditis given PR depression and some mild ST segment elevation in numerous leads. Bedside ultrasound without  evidence of pericardial effusion. Troponins negative 2. Other labs grossly reassuring. Low pretest probability for pulmonary embolism and PERC negative. Presentation not classic for aortic dissection or esophageal perforation. Chest x-ray without evidence suggestive of pneumonia, pneumothorax, pneumomediastinum.  No abnormal contour of the mediastinum to suggest dissection. No evidence of acute injuries.   Will treat for possible pericarditis. Recommended close follow-up with PCP. The patient is safe for discharge with strict return precautions.   Final Clinical Impressions(s) / ED Diagnoses   Final diagnoses:  Chest pain  Acute idiopathic pericarditis   Disposition: Discharge  Condition: Good  I have discussed the results, Dx and Tx plan with the patient who expressed understanding and agree(s) with the plan. Discharge instructions discussed at great length. The patient was  given strict return precautions who verbalized understanding of the instructions. No further questions at time of discharge.    New Prescriptions   IBUPROFEN (ADVIL,MOTRIN) 200 MG TABLET    Take 600 mg every 8 hours for 14 days. On day 15 start taking 400 mg every 8 hours for one week, followed by 200 mg every 8 hours on week four.    Follow Up: Primary care provider  In 1 week For close follow up      Cardama, Amadeo Garnet, MD 04/20/17 2312

## 2017-04-20 NOTE — ED Triage Notes (Signed)
Pt c/o chest tightness x 2 d ays, SOB x 1 week

## 2017-04-21 ENCOUNTER — Encounter: Payer: Self-pay | Admitting: Family Medicine

## 2017-04-22 ENCOUNTER — Ambulatory Visit (INDEPENDENT_AMBULATORY_CARE_PROVIDER_SITE_OTHER): Payer: Self-pay | Admitting: Family Medicine

## 2017-04-22 ENCOUNTER — Encounter: Payer: Self-pay | Admitting: Family Medicine

## 2017-04-22 VITALS — BP 98/78 | HR 82 | Temp 98.2°F | Ht 69.0 in | Wt 210.0 lb

## 2017-04-22 DIAGNOSIS — D649 Anemia, unspecified: Secondary | ICD-10-CM

## 2017-04-22 DIAGNOSIS — Z114 Encounter for screening for human immunodeficiency virus [HIV]: Secondary | ICD-10-CM

## 2017-04-22 DIAGNOSIS — R5383 Other fatigue: Secondary | ICD-10-CM

## 2017-04-22 DIAGNOSIS — M25542 Pain in joints of left hand: Secondary | ICD-10-CM

## 2017-04-22 DIAGNOSIS — I309 Acute pericarditis, unspecified: Secondary | ICD-10-CM

## 2017-04-22 DIAGNOSIS — M25541 Pain in joints of right hand: Secondary | ICD-10-CM

## 2017-04-22 NOTE — Patient Instructions (Addendum)
Give [place] 4-5 business days to get the results of your labs back. If labs are normal, you will likely receive a letter in the mail unless you have MyChart. This can take longer than 2-3 business days.   Let us know if you need anything.

## 2017-04-22 NOTE — Progress Notes (Signed)
Pre visit review using our clinic review tool, if applicable. No additional management support is needed unless otherwise documented below in the visit note. 

## 2017-04-22 NOTE — Progress Notes (Signed)
Chief Complaint  Patient presents with  . Follow-up    ED    Subjective: Patient is a 31 y.o. female here for ED f/u.  Patient was seen in the emergency department yesterday for pericarditis. EKG obtained did show some PR segment depression and some ST segment elevation. She has a history of pericarditis in the past. This is similar presentation. Labs were largely unremarkable. She was told to follow-up with her PCP and discuss autoimmune etiology workup. Her last TB test was around 1 year ago and was negative. No recent travel, cough, or weight loss. She is also been very fatigued. There is no family history of autoimmune diseases.   ROS: Heart: +mild chest pain   Lungs: Denies SOB   MSK: +joint pain  Family History  Problem Relation Age of Onset  . Autoimmune disease Neg Hx    Past Medical History:  Diagnosis Date  . Anemia   . Anxiety   . Hiatal hernia    Allergies  Allergen Reactions  . Latex Swelling  . Shellfish-Derived Products Swelling  . Ativan [Lorazepam]     Adverse reactions/ agitation  . Hydroxyzine     Adverse reactions/agitation  . Latex     Current Outpatient Prescriptions:  .  ALPRAZolam (XANAX) 0.5 MG tablet, Take 0.5 mg by mouth at bedtime as needed for anxiety., Disp: , Rfl:  .  ibuprofen (ADVIL,MOTRIN) 200 MG tablet, Take 600 mg every 8 hours for 14 days. On day 15 start taking 400 mg every 8 hours for one week, followed by 200 mg every 8 hours on week four., Disp: , Rfl:  .  naproxen (EC NAPROSYN) 500 MG EC tablet, Take 1 tab and repeat in 2 hours if headache not improved., Disp: 30 tablet, Rfl: 2 .  SUMAtriptan (IMITREX) 100 MG tablet, Take 1 tablet (100 mg total) by mouth every 2 (two) hours as needed for migraine. May repeat in 2 hours if headache persists or recurs., Disp: 10 tablet, Rfl: 2 .  traZODone (DESYREL) 50 MG tablet, Take 0.5-1 tablets (25-50 mg total) by mouth at bedtime as needed for sleep., Disp: 30 tablet, Rfl: 1 .  venlafaxine XR  (EFFEXOR-XR) 37.5 MG 24 hr capsule, Take 1 capsule (37.5 mg total) by mouth daily with breakfast., Disp: 30 capsule, Rfl: 2  Objective: BP 98/78 (BP Location: Right Arm, Patient Position: Sitting, Cuff Size: Large)   Pulse 82   Temp 98.2 F (36.8 C) (Oral)   Ht  (1.753 m)   Wt 210 lb (95.3 kg)   LMP 03/30/2017   SpO2 99%   BMI 31.01 kg/m  General: Awake, appears stated age HEENT: MMM, EOMi, no oral ulcers Heart: RRR, I do not hear any rubs, no lower extremity edema Lungs: CTAB, no rales, wheezes or rhonchi. No accessory muscle use MSK: No joint tenderness to palpation or edema Abd: BS+, soft, NT, ND, no masses or organomegaly Psych: Age appropriate judgment and insight, normal affect and mood  Assessment and Plan: Other fatigue - Plan: TSH, ANA,IFA RA Diag Pnl w/rflx Tit/Patn  Acute pericarditis, unspecified type - Plan: ANA,IFA RA Diag Pnl w/rflx Tit/Patn  Arthralgia of both hands - Plan: Vitamin D (25 hydroxy), ANA,IFA RA Diag Pnl w/rflx Tit/Patn  Normocytic anemia - Plan: IBC panel, Ferritin  Screening for HIV (human immunodeficiency virus) - Plan: HIV antibody  Orders as above. If labs are unremarkable, will consider referring to cardiology for another opinion. Which also rescreen for TB  which she will  obtain through work. She will likely need Fe supplementation. The patient voiced understanding and agreement to the plan.  Jilda Roche [place], DO 04/22/17  4:46 PM

## 2017-04-23 ENCOUNTER — Other Ambulatory Visit: Payer: Self-pay | Admitting: Family Medicine

## 2017-04-23 DIAGNOSIS — R768 Other specified abnormal immunological findings in serum: Secondary | ICD-10-CM

## 2017-04-23 LAB — VITAMIN D 25 HYDROXY (VIT D DEFICIENCY, FRACTURES): VITD: 10.11 ng/mL — AB (ref 30.00–100.00)

## 2017-04-23 LAB — IBC PANEL
Iron: 89 ug/dL (ref 42–145)
Saturation Ratios: 21.6 % (ref 20.0–50.0)
Transferrin: 294 mg/dL (ref 212.0–360.0)

## 2017-04-23 LAB — FERRITIN: Ferritin: 26.6 ng/mL (ref 10.0–291.0)

## 2017-04-23 LAB — TSH: TSH: 0.64 u[IU]/mL (ref 0.35–4.50)

## 2017-04-23 MED ORDER — VITAMIN D (ERGOCALCIFEROL) 1.25 MG (50000 UNIT) PO CAPS: 50000.0000 [IU] | ORAL_CAPSULE | ORAL | 4 refills | Status: DC

## 2017-04-23 MED ORDER — ALPRAZOLAM 0.5 MG PO TABS: 0.5000 mg | ORAL_TABLET | Freq: Every evening | ORAL | 0 refills | Status: DC | PRN

## 2017-04-23 MED FILL — VIT D2 1.25 MG (50,000 UNIT: 1.25 MG | 28 days supply | Qty: 4 | Fill #0

## 2017-04-23 MED FILL — ALPRAZolam 0.5 MG TABS: 0.5 | 30 days supply | Qty: 30 | Fill #0

## 2017-04-23 NOTE — Progress Notes (Signed)
Patient called wanting results of lab work.  She seen Dr. Bosie Clos is out of the office today. Sent lab work to provider in the office.  She suggested patient start Vitamin D  And a ref. To rheumatology.   Pc

## 2017-04-26 ENCOUNTER — Ambulatory Visit (INDEPENDENT_AMBULATORY_CARE_PROVIDER_SITE_OTHER): Payer: Self-pay | Admitting: Family Medicine

## 2017-04-26 ENCOUNTER — Encounter: Payer: Self-pay | Admitting: Family Medicine

## 2017-04-26 VITALS — BP 104/66 | HR 88 | Temp 98.5°F | Ht 69.0 in | Wt 208.0 lb

## 2017-04-26 DIAGNOSIS — G44209 Tension-type headache, unspecified, not intractable: Secondary | ICD-10-CM

## 2017-04-26 DIAGNOSIS — R768 Other specified abnormal immunological findings in serum: Secondary | ICD-10-CM

## 2017-04-26 DIAGNOSIS — R002 Palpitations: Secondary | ICD-10-CM

## 2017-04-26 MED ORDER — PREDNISONE 10 MG PO TABS: ORAL_TABLET | ORAL | 0 refills | Status: DC

## 2017-04-26 MED FILL — predniSONE 10 MG TABS: 10 | 12 days supply | Qty: 14 | Fill #0

## 2017-04-26 NOTE — Progress Notes (Signed)
Pre visit review using our clinic review tool, if applicable. No additional management support is needed unless otherwise documented below in the visit note. 

## 2017-04-26 NOTE — Patient Instructions (Addendum)
Text if you need anything.  Heat (pad or rice pillow in microwave) over affected area, 10-15 minutes every 2-3 hours while awake.   EXERCISES RANGE OF MOTION (ROM) AND STRETCHING EXERCISES  These exercises may help you when beginning to rehabilitate your issue. In order to successfully resolve your symptoms, you must improve your posture. These exercises are designed to help reduce the forward-head and rounded-shoulder posture which contributes to this condition. Your symptoms may resolve with or without further involvement from your physician, physical therapist or athletic trainer. While completing these exercises, remember:   Restoring tissue flexibility helps normal motion to return to the joints. This allows healthier, less painful movement and activity.  An effective stretch should be held for at least 20 seconds, although you may need to begin with shorter hold times for comfort.  A stretch should never be painful. You should only feel a gentle lengthening or release in the stretched tissue.  Do not do any stretch or exercise that you cannot tolerate.  STRETCH- Axial Extensors  Lie on your back on the floor. You may bend your knees for comfort. Place a rolled-up hand towel or dish towel, about 2 inches in diameter, under the part of your head that makes contact with the floor.  Gently tuck your chin, as if trying to make a "double chin," until you feel a gentle stretch at the base of your head.  Hold 15-20 seconds. Repeat 2-3 times. Complete this exercise 1 time per day.   STRETCH - Axial Extension   Stand or sit on a firm surface. Assume a good posture: chest up, shoulders drawn back, abdominal muscles slightly tense, knees unlocked (if standing) and feet hip width apart.  Slowly retract your chin so your head slides back and your chin slightly lowers. Continue to look straight ahead.  You should feel a gentle stretch in the back of your head. Be certain not to feel an  aggressive stretch since this can cause headaches later.  Hold for 15-20 seconds. Repeat 2-3 times. Complete this exercise 1 time per day.  STRETCH - Cervical Side Bend   Stand or sit on a firm surface. Assume a good posture: chest up, shoulders drawn back, abdominal muscles slightly tense, knees unlocked (if standing) and feet hip width apart.  Without letting your nose or shoulders move, slowly tip your right / left ear to your shoulder until your feel a gentle stretch in the muscles on the opposite side of your neck.  Hold 15-20 seconds. Repeat 2-3 times. Complete this exercise 1-2 times per day.  STRETCH - Cervical Rotators   Stand or sit on a firm surface. Assume a good posture: chest up, shoulders drawn back, abdominal muscles slightly tense, knees unlocked (if standing) and feet hip width apart.  Keeping your eyes level with the ground, slowly turn your head until you feel a gentle stretch along the back and opposite side of your neck.  Hold 15-20 seconds. Repeat 2-3 times. Complete this exercise 1-2 times per day.  RANGE OF MOTION - Neck Circles   Stand or sit on a firm surface. Assume a good posture: chest up, shoulders drawn back, abdominal muscles slightly tense, knees unlocked (if standing) and feet hip width apart.  Gently roll your head down and around from the back of one shoulder to the back of the other. The motion should never be forced or painful.  Repeat the motion 10-20 times, or until you feel the neck muscles relax and loosen.  Repeat 2-3 times. Complete the exercise 1-2 times per day. STRENGTHENING EXERCISES - Cervical Strain and Sprain These exercises may help you when beginning to rehabilitate your injury. They may resolve your symptoms with or without further involvement from your physician, physical therapist, or athletic trainer. While completing these exercises, remember:   Muscles can gain both the endurance and the strength needed for everyday  activities through controlled exercises.  Complete these exercises as instructed by your physician, physical therapist, or athletic trainer. Progress the resistance and repetitions only as guided.  You may experience muscle soreness or fatigue, but the pain or discomfort you are trying to eliminate should never worsen during these exercises. If this pain does worsen, stop and make certain you are following the directions exactly. If the pain is still present after adjustments, discontinue the exercise until you can discuss the trouble with your clinician.  STRENGTH - Cervical Flexors, Isometric  Face a wall, standing about 6 inches away. Place a small pillow, a ball about 6-8 inches in diameter, or a folded towel between your forehead and the wall.  Slightly tuck your chin and gently push your forehead into the soft object. Push only with mild to moderate intensity, building up tension gradually. Keep your jaw and forehead relaxed.  Hold 10 to 20 seconds. Keep your breathing relaxed.  Release the tension slowly. Relax your neck muscles completely before you start the next repetition. Repeat 2-3 times. Complete this exercise 1 time per day.  STRENGTH- Cervical Lateral Flexors, Isometric   Stand about 6 inches away from a wall. Place a small pillow, a ball about 6-8 inches in diameter, or a folded towel between the side of your head and the wall.  Slightly tuck your chin and gently tilt your head into the soft object. Push only with mild to moderate intensity, building up tension gradually. Keep your jaw and forehead relaxed.  Hold 10 to 20 seconds. Keep your breathing relaxed.  Release the tension slowly. Relax your neck muscles completely before you start the next repetition. Repeat 2-3 times. Complete this exercise 1 time per day.  STRENGTH - Cervical Extensors, Isometric   Stand about 6 inches away from a wall. Place a small pillow, a ball about 6-8 inches in diameter, or a folded  towel between the back of your head and the wall.  Slightly tuck your chin and gently tilt your head back into the soft object. Push only with mild to moderate intensity, building up tension gradually. Keep your jaw and forehead relaxed.  Hold 10 to 20 seconds. Keep your breathing relaxed.  Release the tension slowly. Relax your neck muscles completely before you start the next repetition. Repeat 2-3 times. Complete this exercise 1 time per day.  POSTURE AND BODY MECHANICS CONSIDERATIONS Keeping correct posture when sitting, standing or completing your activities will reduce the stress put on different body tissues, allowing injured tissues a chance to heal and limiting painful experiences. The following are general guidelines for improved posture. Your physician or physical therapist will provide you with any instructions specific to your needs. While reading these guidelines, remember:  The exercises prescribed by your provider will help you have the flexibility and strength to maintain correct postures.  The correct posture provides the optimal environment for your joints to work. All of your joints have less wear and tear when properly supported by a spine with good posture. This means you will experience a healthier, less painful body.  Correct posture must be practiced  with all of your activities, especially prolonged sitting and standing. Correct posture is as important when doing repetitive low-stress activities (typing) as it is when doing a single heavy-load activity (lifting).  PROLONGED STANDING WHILE SLIGHTLY LEANING FORWARD When completing a task that requires you to lean forward while standing in one place for a long time, place either foot up on a stationary 2- to 4-inch high object to help maintain the best posture. When both feet are on the ground, the low back tends to lose its slight inward curve. If this curve flattens (or becomes too large), then the back and your other  joints will experience too much stress, fatigue more quickly, and can cause pain.   RESTING POSITIONS Consider which positions are most painful for you when choosing a resting position. If you have pain with flexion-based activities (sitting, bending, stooping, squatting), choose a position that allows you to rest in a less flexed posture. You would want to avoid curling into a fetal position on your side. If your pain worsens with extension-based activities (prolonged standing, working overhead), avoid resting in an extended position such as sleeping on your stomach. Most people will find more comfort when they rest with their spine in a more neutral position, neither too rounded nor too arched. Lying on a non-sagging bed on your side with a pillow between your knees, or on your back with a pillow under your knees will often provide some relief. Keep in mind, being in any one position for a prolonged period of time, no matter how correct your posture, can still lead to stiffness.  WALKING Walk with an upright posture. Your ears, shoulders, and hips should all line up. OFFICE WORK When working at a desk, create an environment that supports good, upright posture. Without extra support, muscles fatigue and lead to excessive strain on joints and other tissues.  CHAIR:  A chair should be able to slide under your desk when your back makes contact with the back of the chair. This allows you to work closely.  The chair's height should allow your eyes to be level with the upper part of your monitor and your hands to be slightly lower than your elbows.  Body position: ? Your feet should make contact with the floor. If this is not possible, use a foot rest. ? Keep your ears over your shoulders. This will reduce stress on your neck and low back.  Temporomandibular Joint Syndrome Temporomandibular joint (TMJ) syndrome is a condition that affects the joints between your jaw and your skull. The TMJs are  located near your ears and allow your jaw to open and close. These joints and the nearby muscles are involved in all movements of the jaw. People with TMJ syndrome have pain in the area of these joints and muscles. Chewing, biting, or other movements of the jaw can be difficult or painful. TMJ syndrome can be caused by various things. In many cases, the condition is mild and goes away within a few weeks. For some people, the condition can become a long-term problem. What are the causes? Possible causes of TMJ syndrome include:  Grinding your teeth or clenching your jaw. Some people do this when they are under stress.  Arthritis.  Injury to the jaw.  Head or neck injury.  Teeth or dentures that are not aligned well.  In some cases, the cause of TMJ syndrome may not be known. What are the signs or symptoms? The most common symptom is an aching  pain on the side of the head in the area of the TMJ. Other symptoms may include:  Pain when moving your jaw, such as when chewing or biting.  Being unable to open your jaw all the way.  Making a clicking sound when you open your mouth.  Headache.  Earache.  Neck or shoulder pain.  How is this diagnosed? Diagnosis can usually be made based on your symptoms, your medical history, and a physical exam. Your health care provider may check the range of motion of your jaw. Imaging tests, such as X-rays or an MRI, are sometimes done. You may need to see your dentist to determine if your teeth and jaw are lined up correctly. How is this treated? TMJ syndrome often goes away on its own. If treatment is needed, the options may include:  Eating soft foods and applying ice or heat.  Medicines to relieve pain or inflammation.  Medicines to relax the muscles.  A splint, bite plate, or mouthpiece to prevent teeth grinding or jaw clenching.  Relaxation techniques or counseling to help reduce stress.  Transcutaneous electrical nerve stimulation  (TENS). This helps to relieve pain by applying an electrical current through the skin.  Acupuncture. This is sometimes helpful to relieve pain.  Jaw surgery. This is rarely needed.  Follow these instructions at home:  Take medicines only as directed by your health care provider.  Eat a soft diet if you are having trouble chewing.  Apply ice to the painful area. ? Put ice in a plastic bag. ? Place a towel between your skin and the bag. ? Leave the ice on for 20 minutes, 2-3 times a day.  Apply a warm compress to the painful area as directed.  Massage your jaw area and perform any jaw stretching exercises as recommended by your health care provider.  If you were given a mouthpiece or bite plate, wear it as directed.  Avoid foods that require a lot of chewing. Do not chew gum.  Keep all follow-up visits as directed by your health care provider. This is important. Contact a health care provider if:  You are having trouble eating.  You have new or worsening symptoms. Get help right away if:  Your jaw locks open or closed. This information is not intended to replace advice given to you by your health care provider. Make sure you discuss any questions you have with your health care provider. Document Released: 04/14/2001 Document Revised: 03/19/2016 Document Reviewed: 02/22/2014 Elsevier Interactive Patient Education  [employer].

## 2017-04-26 NOTE — Progress Notes (Signed)
Chief Complaint  Patient presents with  . Follow-up    Subjective: Patient is a 31 y.o. female here for f/u HA.  The patient has had a dull headache over the past several days. It is located bilaterally, over her entire head. She has bilateral jaw pain and neck pain. She denies any numbness, tearing, or difficulty with speech/swallowing. She has been using Advil daily without much relief.  Last night, she had mild pain, fluttering in her chest, and shortness of breath. Use very similar to her episode of pericarditis. She has been using naproxen twice daily. This made it very difficult for her to sleep. She's been getting intermittent episodes of palpitations. She is also expressing lightheadedness. She does not have either right now. She has been diligent with oral intake of both food and water. She has also increased her salt intake.  +ANA, was referred to Rheum by Dr. Zola Button in my absence. Pt wondering if she can find a provider sooner, if that would be an issue, I told her no.   ROS: Heart: Denies chest pain, +palpitations Lungs: Denies current SOB   Family History  Problem Relation Age of Onset  . Autoimmune disease Neg Hx    Past Medical History:  Diagnosis Date  . Anemia   . Anxiety   . Hiatal hernia    Allergies  Allergen Reactions  . Latex Swelling  . Shellfish-Derived Products Swelling  . Ativan [Lorazepam]     Adverse reactions/ agitation  . Hydroxyzine     Adverse reactions/agitation  . Latex     Current Outpatient Prescriptions:  .  ALPRAZolam (XANAX) 0.5 MG tablet, Take 1 tablet (0.5 mg total) by mouth at bedtime as needed for anxiety., Disp: 30 tablet, Rfl: 0 .  naproxen (EC NAPROSYN) 500 MG EC tablet, Take 1 tab and repeat in 2 hours if headache not improved., Disp: 30 tablet, Rfl: 2 .  SUMAtriptan (IMITREX) 100 MG tablet, Take 1 tablet (100 mg total) by mouth every 2 (two) hours as needed for migraine. May repeat in 2 hours if headache persists or  recurs., Disp: 10 tablet, Rfl: 2 .  traZODone (DESYREL) 50 MG tablet, Take 0.5-1 tablets (25-50 mg total) by mouth at bedtime as needed for sleep., Disp: 30 tablet, Rfl: 1 .  venlafaxine XR (EFFEXOR-XR) 37.5 MG 24 hr capsule, Take 1 capsule (37.5 mg total) by mouth daily with breakfast., Disp: 30 capsule, Rfl: 2 .  Vitamin D, Ergocalciferol, (DRISDOL) 50000 units CAPS capsule, Take 1 capsule (50,000 Units total) by mouth every 7 (seven) days., Disp: 4 capsule, Rfl: 4 .  predniSONE (DELTASONE) 10 MG tablet, 2 tabs for 4 days, 1 tab for 4 days and 1/2 tab for 4 days., Disp: 14 tablet, Rfl: 0  Objective: BP 104/66 (BP Location: Left Arm, Patient Position: Sitting, Cuff Size: Large)   Pulse 88   Temp 98.5 F (36.9 C) (Oral)   Ht  (1.753 m)   Wt 208 lb (94.3 kg)   LMP 03/30/2017   SpO2 97%   BMI 30.72 kg/m  General: Awake, appears stated age HEENT: MMM, EOMi Heart: RRR, no murmurs Lungs: CTAB, no rales, wheezes or rhonchi. No accessory muscle use MSK: +TTP over cerv paraspinal msk b/l and TMJ b/l (pt was chewing gum), no popping of jaw or deviation appreciated Neuro: DTR's equal and symmetric throughout, no cerebellar signs Psych: Age appropriate judgment and insight, normal affect and mood  Assessment and Plan: Positive ANA (antinuclear antibody) - Plan: predniSONE (  DELTASONE) 10 MG tablet, DISCONTINUED: predniSONE (DELTASONE) 10 MG tablet  Tension headache  Palpitations - Plan: Holter monitor - 48 hour  Orders as above. Add on SLE labs as detailed in result note. Take PO Fe in addition to Vit D. Pred taper to see if it helps with symptoms, suggesting a rheum cause. After seeing ANA, I am more doubtful.  Heat, Tylenol, stretches/exercises, TMJ handout, avoid daily Advil for headaches. No current palpitations, recent EKG due to pericarditis and similar issue. Will obtain Holter for now.  F/u pending above.  The patient voiced understanding and agreement to the plan.  Jilda Roche [place], DO 04/26/17  12:15 PM

## 2017-04-27 ENCOUNTER — Other Ambulatory Visit: Payer: Self-pay | Admitting: Family Medicine

## 2017-04-27 ENCOUNTER — Encounter: Payer: Self-pay | Admitting: Family Medicine

## 2017-04-27 LAB — CENTROMERE ANTIBODIES: CENTROMERE AB SCREEN: NEGATIVE AI

## 2017-04-27 LAB — ANTI-SMITH ANTIBODY: ENA SM AB SER-ACNC: NEGATIVE AI

## 2017-04-27 LAB — TEST AUTHORIZATION

## 2017-04-27 LAB — ANTI-NUCLEAR AB-TITER (ANA TITER): ANA Titer 1: 1:160 {titer} — ABNORMAL HIGH

## 2017-04-27 LAB — ANA,IFA RA DIAG PNL W/RFLX TIT/PATN: ANA: POSITIVE — AB

## 2017-04-27 LAB — ANTI-DNA ANTIBODY, DOUBLE-STRANDED: ds DNA Ab: 1 IU/mL

## 2017-04-27 LAB — HIV ANTIBODY (ROUTINE TESTING W REFLEX): HIV: NONREACTIVE

## 2017-04-27 MED ORDER — METRONIDAZOLE 1 % EX GEL: Freq: Every day | CUTANEOUS | 1 refills | Status: AC

## 2017-04-27 NOTE — Progress Notes (Signed)
Metronidazole gel called in for rosacea. Pt notified via MyChart.

## 2017-04-28 MED FILL — metroNIDAZOLE 1 % GEL: 1 | 30 days supply | Qty: 60 | Fill #0

## 2017-05-07 ENCOUNTER — Other Ambulatory Visit: Payer: Self-pay | Admitting: Family Medicine

## 2017-05-07 MED ORDER — METRONIDAZOLE 0.75 % VA GEL: 1.0000 | Freq: Two times a day (BID) | VAGINAL | 0 refills | Status: DC

## 2017-05-07 MED FILL — metroNIDAZOLE 0.75 % GEL: 0.75 | 5 days supply | Qty: 70 | Fill #0

## 2017-05-07 NOTE — Progress Notes (Unsigned)
Patient with a flare of recurrent vaginitis. Has used Metrogel vaginal 0.75% gel previously with good results. Prescription sent to [facility] pharmacy. If no improvement seek further care.

## 2017-05-18 MED FILL — VIT D2 1.25 MG (50,000 UNIT: 1.25 MG | 28 days supply | Qty: 4 | Fill #1

## 2017-05-28 ENCOUNTER — Encounter: Payer: Self-pay | Admitting: Family Medicine

## 2017-06-21 ENCOUNTER — Emergency Department (HOSPITAL_BASED_OUTPATIENT_CLINIC_OR_DEPARTMENT_OTHER)
Admission: EM | Admit: 2017-06-21 | Discharge: 2017-06-21 | Disposition: A | Discharge status: Home / Self Care | Payer: Medicaid Other | Attending: Emergency Medicine | Admitting: Emergency Medicine

## 2017-06-21 ENCOUNTER — Other Ambulatory Visit: Payer: Self-pay

## 2017-06-21 ENCOUNTER — Encounter (HOSPITAL_BASED_OUTPATIENT_CLINIC_OR_DEPARTMENT_OTHER): Payer: Self-pay | Admitting: *Deleted

## 2017-06-21 ENCOUNTER — Emergency Department (HOSPITAL_BASED_OUTPATIENT_CLINIC_OR_DEPARTMENT_OTHER): Payer: Medicaid Other

## 2017-06-21 DIAGNOSIS — J40 Bronchitis, not specified as acute or chronic: Secondary | ICD-10-CM | POA: Insufficient documentation

## 2017-06-21 DIAGNOSIS — Z79899 Other long term (current) drug therapy: Secondary | ICD-10-CM | POA: Insufficient documentation

## 2017-06-21 DIAGNOSIS — Z91040 Latex allergy status: Secondary | ICD-10-CM | POA: Insufficient documentation

## 2017-06-21 DIAGNOSIS — F17210 Nicotine dependence, cigarettes, uncomplicated: Secondary | ICD-10-CM | POA: Insufficient documentation

## 2017-06-21 DIAGNOSIS — R69 Illness, unspecified: Secondary | ICD-10-CM | POA: Insufficient documentation

## 2017-06-21 DIAGNOSIS — J111 Influenza due to unidentified influenza virus with other respiratory manifestations: Secondary | ICD-10-CM

## 2017-06-21 LAB — CBC WITH DIFFERENTIAL/PLATELET
BASOS ABS: 0 10*3/uL (ref 0.0–0.1)
BASOS PCT: 0 %
EOS PCT: 1 %
Eosinophils Absolute: 0 10*3/uL (ref 0.0–0.7)
HCT: 35.5 % — ABNORMAL LOW (ref 36.0–46.0)
Hemoglobin: 11.8 g/dL — ABNORMAL LOW (ref 12.0–15.0)
LYMPHS PCT: 18 %
Lymphs Abs: 0.5 10*3/uL — ABNORMAL LOW (ref 0.7–4.0)
MCH: 31.2 pg (ref 26.0–34.0)
MCHC: 33.2 g/dL (ref 30.0–36.0)
MCV: 93.9 fL (ref 78.0–100.0)
MONO ABS: 0.3 10*3/uL (ref 0.1–1.0)
Monocytes Relative: 11 %
NEUTROS ABS: 2.1 10*3/uL (ref 1.7–7.7)
Neutrophils Relative %: 70 %
PLATELETS: 195 10*3/uL (ref 150–400)
RBC: 3.78 MIL/uL — AB (ref 3.87–5.11)
RDW: 12.1 % (ref 11.5–15.5)
WBC: 2.9 10*3/uL — AB (ref 4.0–10.5)

## 2017-06-21 LAB — BASIC METABOLIC PANEL
ANION GAP: 8 (ref 5–15)
BUN: 7 mg/dL (ref 6–20)
CALCIUM: 8.7 mg/dL — AB (ref 8.9–10.3)
CO2: 20 mmol/L — ABNORMAL LOW (ref 22–32)
Chloride: 104 mmol/L (ref 101–111)
Creatinine, Ser: 0.77 mg/dL (ref 0.44–1.00)
GLUCOSE: 103 mg/dL — AB (ref 65–99)
POTASSIUM: 3 mmol/L — AB (ref 3.5–5.1)
Sodium: 132 mmol/L — ABNORMAL LOW (ref 135–145)

## 2017-06-21 LAB — PREGNANCY, URINE: PREG TEST UR: NEGATIVE

## 2017-06-21 LAB — URINALYSIS, ROUTINE W REFLEX MICROSCOPIC
GLUCOSE, UA: NEGATIVE mg/dL
HGB URINE DIPSTICK: NEGATIVE
KETONES UR: 15 mg/dL — AB
LEUKOCYTES UA: NEGATIVE
Nitrite: NEGATIVE
PROTEIN: 30 mg/dL — AB
Specific Gravity, Urine: >1.03 — ABNORMAL HIGH (ref 1.005–1.030)
pH: 6 (ref 5.0–8.0)

## 2017-06-21 LAB — URINALYSIS, MICROSCOPIC (REFLEX)

## 2017-06-21 NOTE — Discharge Instructions (Signed)
You may take over-the-counter medicine for symptomatic relief, such as Tylenol, Motrin, TheraFlu, Alka seltzer , black elderberry, etc. Please limit acetaminophen (Tylenol) to 4000 mg and Ibuprofen (Motrin, Advil, etc.) to 2400 mg for a 24hr period. Please note that other over-the-counter medicine may contain acetaminophen or ibuprofen as a component of their ingredients.   

## 2017-06-21 NOTE — ED Provider Notes (Signed)
[facility] EMERGENCY DEPARTMENT Provider Note  CSN: [id number] Arrival date & time: 06/21/17 1920  Chief Complaint(s) Fever  HPI Crystal Bradley is a 31 y.o. female   The history is provided by the patient.  Fever   This is a new problem. The current episode started 2 days ago. The problem occurs daily. The problem has not changed since onset.The maximum temperature noted was 102 to 102.9 F. Associated symptoms include congestion, muscle aches and cough. Pertinent negatives include no diarrhea, no vomiting and no headaches. She has tried ibuprofen and cough syrup for the symptoms.    Past Medical History Past Medical History:  Diagnosis Date  . Anemia   . Anxiety   . Hiatal hernia    There are no active problems to display for this patient.  Home Medication(s) Prior to Admission medications   Medication Sig Start Date End Date Taking? Authorizing Provider  ALPRAZolam Prudy Feeler) 0.5 MG tablet Take 1 tablet (0.5 mg total) by mouth at bedtime as needed for anxiety. 04/23/17   Donato Schultz, DO  metroNIDAZOLE (METROGEL) 0.75 % vaginal gel Place 1 Applicatorful vaginally 2 (two) times daily. 05/07/17   Bradd Canary, MD  metroNIDAZOLE (METROGEL) 1 % gel Apply topically daily. 04/27/17 06/22/17  Sharlene Dory, DO  naproxen (EC NAPROSYN) 500 MG EC tablet Take 1 tab and repeat in 2 hours if headache not improved. 04/16/17   Sharlene Dory, DO  predniSONE (DELTASONE) 10 MG tablet 2 tabs for 4 days, 1 tab for 4 days and 1/2 tab for 4 days. 04/26/17   Sharlene Dory, DO  SUMAtriptan (IMITREX) 100 MG tablet Take 1 tablet (100 mg total) by mouth every 2 (two) hours as needed for migraine. May repeat in 2 hours if headache persists or recurs. 04/16/17   Sharlene Dory, DO  traZODone (DESYREL) 50 MG tablet Take 0.5-1 tablets (25-50 mg total) by mouth at bedtime as needed for sleep. 04/16/17   Sharlene Dory, DO  venlafaxine XR  (EFFEXOR-XR) 37.5 MG 24 hr capsule Take 1 capsule (37.5 mg total) by mouth daily with breakfast. 04/16/17   Carmelia Roller, Jilda Roche, DO  Vitamin D, Ergocalciferol, (DRISDOL) 50000 units CAPS capsule Take 1 capsule (50,000 Units total) by mouth every 7 (seven) days. 04/23/17   Donato Schultz, DO                                                                                                                                    Past Surgical History Past Surgical History:  Procedure Laterality Date  . EYE SURGERY     Family History Family History  Problem Relation Age of Onset  . Autoimmune disease Neg Hx     Social History Social History   Tobacco Use  . Smoking status: Current Every Day Smoker    Packs/day: 0.50    Types: Cigarettes  . Smokeless  tobacco: Never Used  Substance Use Topics  . Alcohol use: No    Comment: social  . Drug use: No   Allergies Latex; Shellfish-derived products; Ativan [lorazepam]; Hydroxyzine; and Latex  Review of Systems Review of Systems  Constitutional: Positive for fever.  HENT: Positive for congestion.   Respiratory: Positive for cough.   Gastrointestinal: Negative for diarrhea and vomiting.  Neurological: Negative for headaches.   All other systems are reviewed and are negative for acute change except as noted in the HPI  Physical Exam Vital Signs  I have reviewed the triage vital signs BP 111/85 (BP Location: Right Arm)   Pulse 99   Temp 99.1 F (37.3 C) (Oral)   Resp 18   Ht 5\' 9"  (1.753 m)   Wt 95.3 kg (210 lb)   LMP 06/14/2017   SpO2 100%   BMI 31.01 kg/m   Physical Exam  Constitutional: She is oriented to person, place, and time. She appears well-developed and well-nourished. No distress.  HENT:  Head: Normocephalic and atraumatic.  Nose: Nose normal.  Eyes: Conjunctivae and EOM are normal. Pupils are equal, round, and reactive to light. Right eye exhibits no discharge. Left eye exhibits no discharge. No scleral  icterus.  Neck: Normal range of motion. Neck supple.  Cardiovascular: Normal rate and regular rhythm. Exam reveals no gallop and no friction rub.  No murmur heard. Pulmonary/Chest: Effort normal and breath sounds normal. No stridor. No respiratory distress. She has no rales.  Abdominal: Soft. She exhibits no distension. There is no tenderness.  Musculoskeletal: She exhibits no edema or tenderness.  Neurological: She is alert and oriented to person, place, and time.  Skin: Skin is warm and dry. No rash noted. She is not diaphoretic. No erythema.  Psychiatric: She has a normal mood and affect.  Vitals reviewed.   ED Results and Treatments Labs (all labs ordered are listed, but only abnormal results are displayed) Labs Reviewed  URINALYSIS, ROUTINE W REFLEX MICROSCOPIC - Abnormal; Notable for the following components:      Result Value   Specific Gravity, Urine >1.030 (*)    Bilirubin Urine SMALL (*)    Ketones, ur 15 (*)    Protein, ur 30 (*)    All other components within normal limits  CBC WITH DIFFERENTIAL/PLATELET - Abnormal; Notable for the following components:   WBC 2.9 (*)    RBC 3.78 (*)    Hemoglobin 11.8 (*)    HCT 35.5 (*)    Lymphs Abs 0.5 (*)    All other components within normal limits  BASIC METABOLIC PANEL - Abnormal; Notable for the following components:   Sodium 132 (*)    Potassium 3.0 (*)    CO2 20 (*)    Glucose, Bld 103 (*)    Calcium 8.7 (*)    All other components within normal limits  URINALYSIS, MICROSCOPIC (REFLEX) - Abnormal; Notable for the following components:   Bacteria, UA RARE (*)    Squamous Epithelial / LPF 0-5 (*)    All other components within normal limits  PREGNANCY, URINE  EKG  EKG Interpretation  Date/Time:    Ventricular Rate:    PR Interval:    QRS Duration:   QT Interval:    QTC Calculation:   R Axis:      Text Interpretation:        Radiology Dg Chest 2 View  Result Date: 06/21/2017 CLINICAL DATA:  Cough EXAM: CHEST  2 VIEW COMPARISON:  Chest radiograph 01/29/2016 FINDINGS: The heart size and mediastinal contours are within normal limits. Both lungs are clear. The visualized skeletal structures are unremarkable. IMPRESSION: Normal chest. Electronically Signed   By: Deatra RobinsonKevin  Herman M.D.   On: 06/21/2017 21:35   Pertinent labs & imaging results that were available during my care of the patient were reviewed by me and considered in my medical decision making (see chart for details).  Medications Ordered in ED Medications - No data to display                                                                                                                                  Procedures Procedures  (including critical care time)  Medical Decision Making / ED Course I have reviewed the nursing notes for this encounter and the patient's prior records (if available in EHR or on provided paperwork).    31 y.o. female presents with flu-like symptoms for 2 days. Adequate oral hydration. Rest of history as above.  Patient appears well. No signs of toxicity, patient is interactive. No hypoxia, tachypnea or other signs of respiratory distress. No sign of clinical dehydration. Lung exam clear. Rest of exam as above.  CXR w/o PNA.  Most consistent with flu-like illness   No evidence suggestive of pharyngitis, AOM, PNA, or meningitis.  Discussed Tamiflu prescription but patient declined.  Discussed symptomatic treatment with the patient and they will follow closely with their PCP.    Final Clinical Impression(s) / ED Diagnoses Final diagnoses:  Influenza-like illness  Bronchitis    Disposition: Discharge  Condition: Good  I have discussed the results, Dx and Tx plan with the patient who expressed understanding and agree(s) with the plan. Discharge instructions discussed at great length. The  patient was given strict return precautions who verbalized understanding of the instructions. No further questions at time of discharge.    ED Discharge Orders    None       Follow Up: Sharlene DoryWendling, Nicholas Paul, DO [address] [place] [id number] [phone]  Schedule an appointment as soon as possible for a visit  If symptoms do not improve or  worsen, in 5-7 days     This chart was dictated using voice recognition software.  Despite best efforts to proofread,  errors can occur which can change the documentation meaning.   Nira Connardama, Pedro Eduardo, MD 06/21/17 [phone]

## 2017-06-21 NOTE — ED Notes (Signed)
Patient transported to X-ray 

## 2017-06-21 NOTE — ED Triage Notes (Signed)
Fever, sob, cough, and chills. Lightheaded and dizziness. Lower back pain.

## 2017-06-21 NOTE — ED Notes (Signed)
ED Provider at bedside. 

## 2017-06-23 ENCOUNTER — Encounter: Payer: Self-pay | Admitting: Family Medicine

## 2017-06-28 ENCOUNTER — Other Ambulatory Visit: Payer: Self-pay | Admitting: Family Medicine

## 2017-06-28 ENCOUNTER — Other Ambulatory Visit (INDEPENDENT_AMBULATORY_CARE_PROVIDER_SITE_OTHER): Payer: Self-pay

## 2017-06-28 DIAGNOSIS — E559 Vitamin D deficiency, unspecified: Secondary | ICD-10-CM

## 2017-06-28 DIAGNOSIS — E876 Hypokalemia: Secondary | ICD-10-CM

## 2017-06-28 DIAGNOSIS — R739 Hyperglycemia, unspecified: Secondary | ICD-10-CM

## 2017-06-28 DIAGNOSIS — D649 Anemia, unspecified: Secondary | ICD-10-CM

## 2017-06-28 NOTE — Progress Notes (Signed)
F/u labs ordered from ER visit.

## 2017-06-29 LAB — COMPREHENSIVE METABOLIC PANEL
ALT: 19 U/L (ref 0–35)
AST: 17 U/L (ref 0–37)
Albumin: 4.3 g/dL (ref 3.5–5.2)
Alkaline Phosphatase: 42 U/L (ref 39–117)
BILIRUBIN TOTAL: 0.9 mg/dL (ref 0.2–1.2)
BUN: 7 mg/dL (ref 6–23)
CALCIUM: 9.3 mg/dL (ref 8.4–10.5)
CO2: 25 meq/L (ref 19–32)
CREATININE: 0.79 mg/dL (ref 0.40–1.20)
Chloride: 106 mEq/L (ref 96–112)
GFR: 107.85 mL/min (ref >60.00)
GLUCOSE: 89 mg/dL (ref 70–99)
Potassium: 3.7 mEq/L (ref 3.5–5.1)
Sodium: 139 mEq/L (ref 135–145)
Total Protein: 7.9 g/dL (ref 6.0–8.3)

## 2017-06-29 LAB — CBC WITH DIFFERENTIAL/PLATELET
Basophils Absolute: 0 10*3/uL (ref 0.0–0.1)
Basophils Relative: 0.4 % (ref 0.0–3.0)
EOS PCT: 2 % (ref 0.0–5.0)
Eosinophils Absolute: 0.1 10*3/uL (ref 0.0–0.7)
HCT: 36.4 % (ref 36.0–46.0)
HEMOGLOBIN: 11.9 g/dL — AB (ref 12.0–15.0)
LYMPHS PCT: 49.2 % — AB (ref 12.0–46.0)
Lymphs Abs: 2.8 10*3/uL (ref 0.7–4.0)
MCHC: 32.6 g/dL (ref 30.0–36.0)
MCV: 96.8 fl (ref 78.0–100.0)
MONO ABS: 0.3 10*3/uL (ref 0.1–1.0)
Monocytes Relative: 5.6 % (ref 3.0–12.0)
Neutro Abs: 2.5 10*3/uL (ref 1.4–7.7)
Neutrophils Relative %: 42.8 % — ABNORMAL LOW (ref 43.0–77.0)
Platelets: 200 10*3/uL (ref 150.0–400.0)
RBC: 3.76 Mil/uL — AB (ref 3.87–5.11)
RDW: 12.5 % (ref 11.5–15.5)
WBC: 5.7 10*3/uL (ref 4.0–10.5)

## 2017-06-29 LAB — FERRITIN: FERRITIN: 38.6 ng/mL (ref 10.0–291.0)

## 2017-06-29 LAB — IBC PANEL
IRON: 158 ug/dL — AB (ref 42–145)
Saturation Ratios: 37.1 % (ref 20.0–50.0)
Transferrin: 304 mg/dL (ref 212.0–360.0)

## 2017-06-29 LAB — VITAMIN D 25 HYDROXY (VIT D DEFICIENCY, FRACTURES): VITD: 18.67 ng/mL — AB (ref 30.00–100.00)

## 2017-06-29 LAB — HEMOGLOBIN A1C: HEMOGLOBIN A1C: 5.2 % (ref 4.6–6.5)

## 2017-07-19 ENCOUNTER — Other Ambulatory Visit: Payer: Self-pay | Admitting: Family Medicine

## 2017-07-19 DIAGNOSIS — R768 Other specified abnormal immunological findings in serum: Secondary | ICD-10-CM

## 2017-07-20 MED ORDER — PREDNISONE 10 MG PO TABS: ORAL_TABLET | ORAL | 0 refills | Status: DC

## 2017-07-20 MED FILL — predniSONE 10 MG TABS: 10 | 12 days supply | Qty: 14 | Fill #0

## 2017-07-20 NOTE — Telephone Encounter (Signed)
Rx sent. Pt still having HA's, states she feels Imitrex is not working.

## 2017-07-20 NOTE — Telephone Encounter (Signed)
This was meant to be a one time rx, is she having a HA? If so, OK to resend. TY.

## 2017-07-20 NOTE — Telephone Encounter (Signed)
Crystal Bradley requesting refill on prednisone- sending directly to PCP.

## 2017-07-21 ENCOUNTER — Telehealth: Payer: Self-pay | Admitting: Family Medicine

## 2017-07-21 MED ORDER — RIZATRIPTAN BENZOATE 10 MG PO TABS: 10.0000 mg | ORAL_TABLET | ORAL | 0 refills | Status: DC | PRN

## 2017-07-21 MED FILL — RIZATRIPTAN 10 MG TABLET: 10 | 30 days supply | Qty: 10 | Fill #0

## 2017-07-21 NOTE — Telephone Encounter (Signed)
[facility] called in, imitrex d/c'd.

## 2017-07-22 ENCOUNTER — Ambulatory Visit (INDEPENDENT_AMBULATORY_CARE_PROVIDER_SITE_OTHER): Payer: Self-pay

## 2017-07-22 DIAGNOSIS — Z3042 Encounter for surveillance of injectable contraceptive: Secondary | ICD-10-CM

## 2017-07-22 MED ORDER — MEDROXYPROGESTERONE ACETATE 150 MG/ML IM SUSP
150.0000 mg | Freq: Once | INTRAMUSCULAR | Status: AC
  Administered 2017-07-22: 150 mg via INTRAMUSCULAR

## 2017-07-23 ENCOUNTER — Other Ambulatory Visit: Payer: Self-pay | Admitting: Family Medicine

## 2017-07-23 DIAGNOSIS — F411 Generalized anxiety disorder: Secondary | ICD-10-CM

## 2017-07-23 MED ORDER — TRAZODONE HCL 50 MG PO TABS: 25.0000 mg | ORAL_TABLET | Freq: Every evening | ORAL | 5 refills | Status: DC | PRN

## 2017-07-23 MED FILL — traZODone HCL 50 MG TABS: 50 | 30 days supply | Qty: 30 | Fill #0

## 2017-08-04 ENCOUNTER — Encounter: Payer: Self-pay | Admitting: Family Medicine

## 2017-08-17 ENCOUNTER — Other Ambulatory Visit: Payer: Self-pay

## 2017-08-17 MED ORDER — OMEPRAZOLE 40 MG PO CPDR: 40.0000 mg | DELAYED_RELEASE_CAPSULE | Freq: Every day | ORAL | 1 refills | Status: DC

## 2017-08-17 MED FILL — OMEPRAZOLE DR 40 MG CAPSULE: 40 | 30 days supply | Qty: 30 | Fill #0

## 2017-08-26 ENCOUNTER — Encounter: Payer: Self-pay | Admitting: Family Medicine

## 2017-08-27 ENCOUNTER — Ambulatory Visit (INDEPENDENT_AMBULATORY_CARE_PROVIDER_SITE_OTHER): Payer: Medicaid Other | Admitting: Family Medicine

## 2017-08-27 ENCOUNTER — Other Ambulatory Visit: Payer: Self-pay | Admitting: Family Medicine

## 2017-08-27 ENCOUNTER — Other Ambulatory Visit: Payer: Medicaid Other

## 2017-08-27 ENCOUNTER — Other Ambulatory Visit (HOSPITAL_COMMUNITY)
Admission: RE | Admit: 2017-08-27 | Discharge: 2017-08-27 | Disposition: A | Discharge status: Home / Self Care | Payer: Medicaid Other | Source: Ambulatory Visit | Attending: Family Medicine | Admitting: Family Medicine

## 2017-08-27 ENCOUNTER — Encounter: Payer: Self-pay | Admitting: Family Medicine

## 2017-08-27 VITALS — BP 110/82 | HR 64 | Temp 98.3°F | Ht 69.0 in | Wt 205.0 lb

## 2017-08-27 DIAGNOSIS — N760 Acute vaginitis: Secondary | ICD-10-CM | POA: Insufficient documentation

## 2017-08-27 DIAGNOSIS — B9689 Other specified bacterial agents as the cause of diseases classified elsewhere: Secondary | ICD-10-CM | POA: Insufficient documentation

## 2017-08-27 DIAGNOSIS — B373 Candidiasis of vulva and vagina: Secondary | ICD-10-CM | POA: Insufficient documentation

## 2017-08-27 DIAGNOSIS — F411 Generalized anxiety disorder: Secondary | ICD-10-CM

## 2017-08-27 MED ORDER — CLONAZEPAM 0.5 MG PO TABS
0.5000 mg | ORAL_TABLET | Freq: Two times a day (BID) | ORAL | 1 refills | Status: DC | PRN
Start: 2017-08-27 — End: 2017-11-26

## 2017-08-27 MED ORDER — SERTRALINE HCL 50 MG PO TABS: 50.0000 mg | ORAL_TABLET | Freq: Every day | ORAL | 3 refills | Status: DC

## 2017-08-27 MED FILL — clonazePAM 0.5 MG TABS: 0.5 | 15 days supply | Qty: 30 | Fill #0

## 2017-08-27 MED FILL — SERTRALINE HCL 50 MG TABLET: 50 | 30 days supply | Qty: 30 | Fill #0
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
## 2017-08-27 NOTE — Patient Instructions (Signed)
Let us know if you need anything.  

## 2017-08-27 NOTE — Progress Notes (Signed)
Pre visit review using our clinic review tool, if applicable. No additional management support is needed unless otherwise documented below in the visit note. 

## 2017-08-27 NOTE — Progress Notes (Signed)
Patient with some vaginal irritation and itching. No obvious fevers, abdominal pain, increasing low back pain. No discharge or other concerns.

## 2017-08-30 LAB — URINE CYTOLOGY ANCILLARY ONLY
Chlamydia: NEGATIVE
Neisseria Gonorrhea: NEGATIVE
TRICH (WINDOWPATH): NEGATIVE

## 2017-09-02 ENCOUNTER — Other Ambulatory Visit: Payer: Self-pay | Admitting: Family Medicine

## 2017-09-02 LAB — URINE CYTOLOGY ANCILLARY ONLY: Candida vaginitis: POSITIVE — AB

## 2017-09-02 MED ORDER — METRONIDAZOLE 500 MG PO TABS: 500.0000 mg | ORAL_TABLET | Freq: Two times a day (BID) | ORAL | 0 refills | Status: DC

## 2017-09-02 MED ORDER — FLUCONAZOLE 150 MG PO TABS: ORAL_TABLET | ORAL | 0 refills | Status: DC

## 2017-09-02 MED FILL — FLUCONAZOLE 150 MG TABLET: 150 | 14 days supply | Qty: 2 | Fill #0

## 2017-09-02 MED FILL — metroNIDAZOLE 500 MG TABS: 500 | 7 days supply | Qty: 14 | Fill #0

## 2017-09-24 ENCOUNTER — Encounter: Payer: Self-pay | Admitting: Family Medicine

## 2017-09-24 ENCOUNTER — Other Ambulatory Visit: Payer: Self-pay | Admitting: Family Medicine

## 2017-09-24 DIAGNOSIS — F411 Generalized anxiety disorder: Secondary | ICD-10-CM

## 2017-09-29 ENCOUNTER — Telehealth: Payer: Self-pay | Admitting: *Deleted

## 2017-09-29 NOTE — Telephone Encounter (Signed)
Received FMLA/STD paperwork from [employer], will complete as much as possible; then forwarded to provider/SLS 02/27

## 2017-10-06 NOTE — Telephone Encounter (Signed)
Attempted to call pt regarding paperwork. LVM.

## 2017-10-07 NOTE — Telephone Encounter (Signed)
Checking in patients chart to see if patient returned phone call from PCP on 10/06/2017. Informed PCP that no return call today from patient

## 2017-10-11 ENCOUNTER — Encounter: Payer: Self-pay | Admitting: Family Medicine

## 2017-10-24 ENCOUNTER — Other Ambulatory Visit: Payer: Self-pay | Admitting: Family Medicine

## 2017-10-25 ENCOUNTER — Other Ambulatory Visit: Payer: Self-pay | Admitting: Family Medicine

## 2017-10-25 ENCOUNTER — Ambulatory Visit (INDEPENDENT_AMBULATORY_CARE_PROVIDER_SITE_OTHER): Payer: Medicaid Other | Admitting: Family Medicine

## 2017-10-25 ENCOUNTER — Encounter: Payer: Self-pay | Admitting: Family Medicine

## 2017-10-25 VITALS — BP 132/82 | HR 102 | Temp 98.4°F | Ht 69.0 in | Wt 195.0 lb

## 2017-10-25 DIAGNOSIS — N649 Disorder of breast, unspecified: Secondary | ICD-10-CM

## 2017-10-25 DIAGNOSIS — N6320 Unspecified lump in the left breast, unspecified quadrant: Secondary | ICD-10-CM

## 2017-10-25 DIAGNOSIS — N6459 Other signs and symptoms in breast: Secondary | ICD-10-CM

## 2017-10-25 NOTE — Patient Instructions (Signed)
2-3 days to hear about US, if not, call/MyChart message for update.  Let us know if you need anything.

## 2017-10-25 NOTE — Progress Notes (Signed)
Pre visit review using our clinic review tool, if applicable. No additional management support is needed unless otherwise documented below in the visit note. 

## 2017-10-25 NOTE — Progress Notes (Signed)
Chief Complaint  Patient presents with  . Follow-up    Crystal Bradley is a 32 y.o. female here for a skin complaint.  Duration: 3 days Location: underside of breast on L Pruritic? No Painful? No Drainage from area or nipple? No Injury? No Other associated symptoms: none +famhx of breast cancer, mother passed away from multiple myeloma complications, 2 aunts had BC in 85's.  ROS:  Const: No fevers Skin: As noted in HPI  Past Medical History:  Diagnosis Date  . Anemia   . Anxiety   . Hiatal hernia     BP 132/82 (BP Location: Left Arm, Patient Position: Sitting, Cuff Size: Normal)   Pulse (!) 102   Temp 98.4 F (36.9 C) (Oral)   Ht _0  (1.753 m)   Wt 195 lb (88.5 kg)   SpO2 97%   BMI 28.80 kg/m  Gen: awake, alert, appearing stated age Lungs: No accessory muscle use Breast: Pt was examined in presence of female chaperone. No gross changes or dimpling. Around 6 o'clock, there is a longitudinally elliptical mass that is freely moveable and solid. There is no fluctuance or ttp, no excessive warmth or irreg borders appreciated. It measures approx 1.8 cm x 0.8 cm. I did not appreciate any adenopathy or enlarged nodes in axillae.  Psych: Age appropriate judgment and insight  Breast complaint - Plan: US BREAST LTD UNI LEFT INC AXILLA  Orders as above. F/u prn. The patient voiced understanding and agreement to the plan.  [place], DO 10/25/17 9:25 AM

## 2017-10-26 MED ORDER — METRONIDAZOLE 500 MG PO TABS: 500.0000 mg | ORAL_TABLET | Freq: Two times a day (BID) | ORAL | 0 refills | Status: DC

## 2017-10-26 MED ORDER — FLUCONAZOLE 150 MG PO TABS: ORAL_TABLET | ORAL | 0 refills | Status: DC

## 2017-10-26 MED FILL — metroNIDAZOLE 500 MG TABS: 500 | 7 days supply | Qty: 14 | Fill #0

## 2017-10-26 MED FILL — FLUCONAZOLE 150 MG TABS: 150 | 2 days supply | Qty: 2 | Fill #0

## 2017-10-29 ENCOUNTER — Ambulatory Visit
Admission: RE | Admit: 2017-10-29 | Discharge: 2017-10-29 | Disposition: A | Discharge status: Home / Self Care | Payer: Medicaid Other | Source: Ambulatory Visit | Attending: Family Medicine | Admitting: Family Medicine

## 2017-10-29 DIAGNOSIS — N6320 Unspecified lump in the left breast, unspecified quadrant: Secondary | ICD-10-CM

## 2017-10-29 MED FILL — SERTRALINE HCL 50 MG TABLET: 50 | 30 days supply | Qty: 30 | Fill #1

## 2017-10-29 MED FILL — clonazePAM 0.5 MG TABS: 0.5 | 15 days supply | Qty: 30 | Fill #1

## 2017-10-29 MED FILL — VIT D2 1.25 MG (50,000 UNIT: 1.25 MG | 28 days supply | Qty: 4 | Fill #2

## 2017-11-03 ENCOUNTER — Other Ambulatory Visit: Payer: Self-pay | Admitting: Family Medicine

## 2017-11-03 ENCOUNTER — Encounter: Payer: Self-pay | Admitting: Family Medicine

## 2017-11-03 ENCOUNTER — Ambulatory Visit (INDEPENDENT_AMBULATORY_CARE_PROVIDER_SITE_OTHER): Payer: Self-pay | Admitting: Family Medicine

## 2017-11-03 VITALS — BP 102/68 | HR 77 | Temp 98.1°F | Ht 69.0 in | Wt 197.2 lb

## 2017-11-03 DIAGNOSIS — M79671 Pain in right foot: Secondary | ICD-10-CM

## 2017-11-03 DIAGNOSIS — E559 Vitamin D deficiency, unspecified: Secondary | ICD-10-CM

## 2017-11-03 DIAGNOSIS — M79642 Pain in left hand: Secondary | ICD-10-CM

## 2017-11-03 DIAGNOSIS — B353 Tinea pedis: Secondary | ICD-10-CM

## 2017-11-03 DIAGNOSIS — M79672 Pain in left foot: Secondary | ICD-10-CM

## 2017-11-03 DIAGNOSIS — M79641 Pain in right hand: Secondary | ICD-10-CM

## 2017-11-03 LAB — URIC ACID: URIC ACID, SERUM: 3 mg/dL (ref 2.4–7.0)

## 2017-11-03 LAB — VITAMIN D 25 HYDROXY (VIT D DEFICIENCY, FRACTURES): VITD: 12.24 ng/mL — ABNORMAL LOW (ref 30.00–100.00)

## 2017-11-03 MED ORDER — METHOCARBAMOL 500 MG PO TABS: 500.0000 mg | ORAL_TABLET | Freq: Three times a day (TID) | ORAL | 0 refills | Status: DC | PRN

## 2017-11-03 MED ORDER — KETOCONAZOLE 2 % EX CREA: 1.0000 "application " | TOPICAL_CREAM | Freq: Every day | CUTANEOUS | 1 refills | Status: AC

## 2017-11-03 MED ORDER — VITAMIN D (ERGOCALCIFEROL) 1.25 MG (50000 UNIT) PO CAPS
50000.0000 [IU] | ORAL_CAPSULE | ORAL | 4 refills | Status: DC
Start: 2017-11-03 — End: 2018-04-21

## 2017-11-03 NOTE — Progress Notes (Signed)
Pre visit review using our clinic review tool, if applicable. No additional management support is needed unless otherwise documented below in the visit note. 

## 2017-11-03 NOTE — Progress Notes (Signed)
Musculoskeletal Exam  Patient: Crystal Bradley DOB: [birth year]  DOS: 11/03/2017  SUBJECTIVE:  Chief Complaint:   Chief Complaint  Patient presents with  . Follow-up    Crystal Bradley is a 32 y.o.  female for evaluation and treatment of hand and foot pain.   Onset:  2 weeks ago. Sudden.  Location: Entire hands and feet Character:  Pressure  Progression of issue: No current pain Associated symptoms: none There appears to be no alleviating or exacerbating factors Treatment: to date has been OTC NSAIDS.   Neurovascular symptoms: no  Has been having pain of R temple for same duration of time. Has hx of migraines, but does not feel similar. It will sometimes bother her when she is eating, no jaw pain and does not grind teeth or chew gum.   She is having fatigue and a motivation.  She has been having many social stressors in life.   A couple weeks of skin changes between her toes on L  ROS: Musculoskeletal/Extremities: +pain of hands and feet\ Neuro: No numbness/tingling, weakness  Past Medical History:  Diagnosis Date  . Anemia   . Anxiety   . Hiatal hernia     Objective: VITAL SIGNS: BP 102/68 (BP Location: Left Arm, Patient Position: Sitting, Cuff Size: Large)   Pulse 77   Temp 98.1 F (36.7 C) (Oral)   Ht 5\' 9"  (1.753 m)   Wt 197 lb 4 oz (89.5 kg)   LMP 10/22/2017   SpO2 98%   BMI 29.13 kg/m  Constitutional: Well formed, well developed. No acute distress. Cardiovascular: Brisk cap refill, RRR Thorax & Lungs: No accessory muscle use. CTAB Musculoskeletal: hands/feet.   Normal active range of motion: yes.   Normal passive range of motion: yes Tenderness to palpation: no pain anywhere palpated Deformity: no Ecchymosis: no Tests positive: None Tests negative: Tinel's of wrist, ankles- anterior drawer, squeeze Neurologic: Normal sensory function. No focal deficits noted. DTR's equal and symmetry in UE/LE's. No clonus. Skin: macerated skin  between 4th and 5th digit on L, no erythema or drainage Psychiatric: Normal mood. Age appropriate judgment and insight. Alert & oriented x 3.    Assessment:  Pain in both hands - Plan: methocarbamol (ROBAXIN) 500 MG tablet, Uric acid  Pain in both feet - Plan: methocarbamol (ROBAXIN) 500 MG tablet, Uric acid  Vitamin D deficiency - Plan: Vitamin D (25 hydroxy)  Tinea pedis of left foot - Plan: ketoconazole (NIZORAL) 2 % cream  Plan: Orders as above.  Trial muscle relaxant. Ck uric acid and Vit D. Could be related to stress issues, but as she is not having issues currently, will keep an eye on things further. Topical NSAID if s/s's return, she will send us a MyChart message.  Ketoconazole for skin.  F/u prn.  The patient voiced understanding and agreement to the plan.   Jilda Rocheicholas Paul ParkmanWendling, DO 11/03/17  8:31 AM

## 2017-11-03 NOTE — Patient Instructions (Signed)
Arch supports may be helpful- [employer] is an option on [employer].  Ice/cold pack over area for 10-15 min twice daily.  Let us know if you need anything.

## 2017-11-26 ENCOUNTER — Other Ambulatory Visit: Payer: Self-pay | Admitting: Family Medicine

## 2017-11-26 DIAGNOSIS — F411 Generalized anxiety disorder: Secondary | ICD-10-CM

## 2017-12-08 MED FILL — SERTRALINE HCL 50 MG TABLET: 50 | 30 days supply | Qty: 30 | Fill #2

## 2017-12-08 MED FILL — clonazePAM 0.5 MG TABS: 0.5 | 15 days supply | Qty: 30 | Fill #0

## 2017-12-10 MED FILL — PHENTERMINE 37.5 MG TABLET: 37.5 | 30 days supply | Qty: 30 | Fill #0

## 2018-03-16 ENCOUNTER — Emergency Department (HOSPITAL_BASED_OUTPATIENT_CLINIC_OR_DEPARTMENT_OTHER)
Admission: EM | Admit: 2018-03-16 | Discharge: 2018-03-16 | Disposition: A | Discharge status: Home / Self Care | Payer: 59 | Attending: Emergency Medicine | Admitting: Emergency Medicine

## 2018-03-16 ENCOUNTER — Encounter (HOSPITAL_BASED_OUTPATIENT_CLINIC_OR_DEPARTMENT_OTHER): Payer: Self-pay | Admitting: Emergency Medicine

## 2018-03-16 ENCOUNTER — Other Ambulatory Visit: Payer: Self-pay

## 2018-03-16 DIAGNOSIS — Z3202 Encounter for pregnancy test, result negative: Secondary | ICD-10-CM | POA: Diagnosis not present

## 2018-03-16 DIAGNOSIS — Z79899 Other long term (current) drug therapy: Secondary | ICD-10-CM | POA: Diagnosis not present

## 2018-03-16 DIAGNOSIS — R202 Paresthesia of skin: Secondary | ICD-10-CM | POA: Diagnosis not present

## 2018-03-16 DIAGNOSIS — F17210 Nicotine dependence, cigarettes, uncomplicated: Secondary | ICD-10-CM | POA: Diagnosis not present

## 2018-03-16 DIAGNOSIS — G4489 Other headache syndrome: Secondary | ICD-10-CM | POA: Diagnosis not present

## 2018-03-16 DIAGNOSIS — Z91040 Latex allergy status: Secondary | ICD-10-CM | POA: Diagnosis not present

## 2018-03-16 DIAGNOSIS — R51 Headache: Secondary | ICD-10-CM | POA: Diagnosis present

## 2018-03-16 DIAGNOSIS — M791 Myalgia, unspecified site: Secondary | ICD-10-CM | POA: Diagnosis not present

## 2018-03-16 LAB — CBC WITH DIFFERENTIAL/PLATELET
Basophils Absolute: 0 K/uL (ref 0.0–0.1)
Basophils Relative: 0 %
Eosinophils Absolute: 0.3 K/uL (ref 0.0–0.7)
Eosinophils Relative: 5 %
HCT: 38.3 % (ref 36.0–46.0)
Hemoglobin: 12.5 g/dL (ref 12.0–15.0)
Lymphocytes Relative: 40 %
Lymphs Abs: 2.6 K/uL (ref 0.7–4.0)
MCH: 31.3 pg (ref 26.0–34.0)
MCHC: 32.6 g/dL (ref 30.0–36.0)
MCV: 96 fL (ref 78.0–100.0)
Monocytes Absolute: 0.4 K/uL (ref 0.1–1.0)
Monocytes Relative: 7 %
Neutro Abs: 3.1 K/uL (ref 1.7–7.7)
Neutrophils Relative %: 48 %
Platelets: 252 K/uL (ref 150–400)
RBC: 3.99 MIL/uL (ref 3.87–5.11)
RDW: 12.6 % (ref 11.5–15.5)
WBC: 6.4 K/uL (ref 4.0–10.5)

## 2018-03-16 LAB — COMPREHENSIVE METABOLIC PANEL WITH GFR
ALT: 13 U/L (ref 0–44)
AST: 19 U/L (ref 15–41)
Albumin: 3.7 g/dL (ref 3.5–5.0)
Alkaline Phosphatase: 43 U/L (ref 38–126)
Anion gap: 7 (ref 5–15)
BUN: 6 mg/dL (ref 6–20)
CO2: 25 mmol/L (ref 22–32)
Calcium: 8.6 mg/dL — ABNORMAL LOW (ref 8.9–10.3)
Chloride: 106 mmol/L (ref 98–111)
Creatinine, Ser: 0.8 mg/dL (ref 0.44–1.00)
GFR calc Af Amer: >60 mL/min (ref >60)
GFR calc non Af Amer: >60 mL/min (ref >60)
Glucose, Bld: 86 mg/dL (ref 70–99)
Potassium: 3.7 mmol/L (ref 3.5–5.1)
Sodium: 138 mmol/L (ref 135–145)
Total Bilirubin: 0.4 mg/dL (ref 0.3–1.2)
Total Protein: 7.1 g/dL (ref 6.5–8.1)

## 2018-03-16 LAB — URINALYSIS, ROUTINE W REFLEX MICROSCOPIC
Bilirubin Urine: NEGATIVE
Glucose, UA: NEGATIVE mg/dL
Hgb urine dipstick: NEGATIVE
Ketones, ur: NEGATIVE mg/dL
LEUKOCYTES UA: NEGATIVE
Nitrite: NEGATIVE
Protein, ur: NEGATIVE mg/dL
SPECIFIC GRAVITY, URINE: 1.01 (ref 1.005–1.030)
pH: 6.5 (ref 5.0–8.0)

## 2018-03-16 LAB — PREGNANCY, URINE: Preg Test, Ur: NEGATIVE

## 2018-03-16 LAB — CK: Total CK: 106 U/L (ref 38–234)

## 2018-03-16 MED ORDER — DIPHENHYDRAMINE HCL 50 MG/ML IJ SOLN
25.0000 mg | Freq: Once | INTRAMUSCULAR | Status: AC
  Administered 2018-03-16: 25 mg via INTRAVENOUS
  Filled 2018-03-16: qty 1

## 2018-03-16 MED ORDER — METOCLOPRAMIDE HCL 5 MG/ML IJ SOLN
10.0000 mg | Freq: Once | INTRAMUSCULAR | Status: AC
  Administered 2018-03-16: 10 mg via INTRAVENOUS
  Filled 2018-03-16: qty 2

## 2018-03-16 MED ORDER — SODIUM CHLORIDE 0.9 % IV BOLUS (SEPSIS)
1000.0000 mL | Freq: Once | INTRAVENOUS | Status: AC
  Administered 2018-03-16: 1000 mL via INTRAVENOUS

## 2018-03-16 MED ORDER — GABAPENTIN 100 MG PO CAPS
100.0000 mg | ORAL_CAPSULE | Freq: Two times a day (BID) | ORAL | 0 refills | Status: DC
Start: 2018-03-16 — End: 2018-09-13

## 2018-03-16 MED ORDER — KETOROLAC TROMETHAMINE 30 MG/ML IJ SOLN
15.0000 mg | Freq: Once | INTRAMUSCULAR | Status: AC
  Administered 2018-03-16: 15 mg via INTRAVENOUS
  Filled 2018-03-16: qty 1

## 2018-03-16 NOTE — ED Notes (Signed)
Pt unable to give us a urine sample at this time.

## 2018-03-16 NOTE — Discharge Instructions (Signed)

## 2018-03-16 NOTE — ED Triage Notes (Signed)
Pt states she has had a headache for the past couple of days  Pt states pain is bad in her temple area and had a nose bleed earlier today  Pt also states she is having body aches for about a week but worse over the weekend

## 2018-03-16 NOTE — ED Provider Notes (Signed)
[facility] EMERGENCY DEPARTMENT Provider Note   CSN: [id number] Arrival date & time: 03/16/18  0017     History   Chief Complaint Chief Complaint  Patient presents with  . Headache    HPI Crystal Bradley is a 32 y.o. female.  The history is provided by the spouse.  Headache   This is a new problem. The current episode started 2 days ago. The problem occurs constantly. The problem has been gradually worsening. The headache is associated with bright light. The pain is moderate. Associated symptoms include nausea. Pertinent negatives include no fever, no syncope and no vomiting.  She presents for multiple complaints.  She reports over the past several days she has been having pain and cramping in bilateral arms/bilateral legs.  She also reports the feeling of pins-and-needles in both hands and feet.  No focal weakness.  She reports has had these symptoms previously.  She also reports over the past 2 days she is been having increasing generalized headache with associated photophobia.  She also reports a nosebleed earlier tonight.  No fevers or vomiting.  No focal weakness.  No syncope.  No slurred speech.  No recent head trauma.  No tick bites or rash.  No recent travel. She reports similar type headaches in the past, but usually does not have nosebleed Reports that she is being worked up for an autoimmune disorder, but still need to see a rheumatologist Past Medical History:  Diagnosis Date  . Anemia   . Anxiety   . Hiatal hernia     Patient Active Problem List   Diagnosis Date Noted  . Vitamin D deficiency 11/03/2017  . GAD (generalized anxiety disorder) 08/27/2017    Past Surgical History:  Procedure Laterality Date  . EYE SURGERY       OB History   None      Home Medications    Prior to Admission medications   Medication Sig Start Date End Date Taking? Authorizing Provider  amphetamine-dextroamphetamine (ADDERALL) 20 MG tablet Take 20 mg by  mouth daily.   Yes [provider]  clonazePAM (KLONOPIN) 0.5 MG tablet TAKE 1 TABLET (0.5 MG TOTAL) BY MOUTH 2 (TWO) TIMES DAILY AS NEEDED FOR ANXIETY. 11/26/17  Yes Wendling, Jilda RocheNicholas Paul, DO  dexlansoprazole (DEXILANT) 60 MG capsule Take 60 mg by mouth daily.   Yes [provider]  naproxen (EC NAPROSYN) 500 MG EC tablet Take 1 tab and repeat in 2 hours if headache not improved. 04/16/17  Yes Sharlene DoryWendling, Nicholas Paul, DO  sertraline (ZOLOFT) 50 MG tablet Take 1.5 tablets (75 mg total) by mouth daily. 09/24/17  Yes Sharlene DoryWendling, Nicholas Paul, DO  fluconazole (DIFLUCAN) 150 MG tablet Take 1 tablet once, repeat in 48 hours if no improvement. 10/26/17   Sharlene DoryWendling, Nicholas Paul, DO  methocarbamol (ROBAXIN) 500 MG tablet Take 1 tablet (500 mg total) by mouth every 8 (eight) hours as needed for muscle spasms. 11/03/17   Sharlene DoryWendling, Nicholas Paul, DO  metroNIDAZOLE (FLAGYL) 500 MG tablet Take 1 tablet (500 mg total) by mouth 2 (two) times daily. Take for 7 days 10/26/17   Sharlene DoryWendling, Nicholas Paul, DO  omeprazole (PRILOSEC) 40 MG capsule Take 1 capsule (40 mg total) by mouth daily. 08/17/17   Sharlene DoryWendling, Nicholas Paul, DO  traZODone (DESYREL) 50 MG tablet Take 0.5-1 tablets (25-50 mg total) by mouth at bedtime as needed for sleep. 07/23/17   Sharlene DoryWendling, Nicholas Paul, DO  Vitamin D, Ergocalciferol, (DRISDOL) 50000 units CAPS capsule Take 1 capsule (  50,000 Units total) by mouth every 7 (seven) days. 11/03/17   Sharlene Dory, DO    Family History Family History  Problem Relation Age of Onset  . Cancer Mother        MM  . Breast cancer Maternal Aunt   . Autoimmune disease Neg Hx     Social History Social History   Tobacco Use  . Smoking status: Current Every Day Smoker    Packs/day: 0.50    Types: Cigarettes  . Smokeless tobacco: Never Used  Substance Use Topics  . Alcohol use: No    Comment: social  . Drug use: No     Allergies   Latex; Shellfish-derived products; Ativan  [lorazepam]; Hydroxyzine; and Latex   Review of Systems Review of Systems  Constitutional: Negative for fever.  HENT: Positive for nosebleeds.   Eyes: Positive for photophobia.       Denies visual loss  Cardiovascular: Negative for chest pain and syncope.  Gastrointestinal: Positive for nausea. Negative for vomiting.  Musculoskeletal: Positive for myalgias.  Neurological: Positive for headaches. Negative for speech difficulty and weakness.  All other systems reviewed and are negative.    Physical Exam Updated Vital Signs BP 120/81 (BP Location: Right Arm)   Pulse 80   Temp 98.2 F (36.8 C) (Oral)   Resp 16   Ht 1.753 m (5\' 9" )   Wt 86.2 kg   SpO2 100%   BMI 28.06 kg/m   Physical Exam  CONSTITUTIONAL: Well developed/well nourished HEAD: Normocephalic/atraumatic EYES: EOMI/PERRL, no nystagmus, no ptosis ENMT: Mucous membranes moist, no evidence of epistaxis, nose ring noted NECK: supple no meningeal signs, no bruits SPINE/BACK:entire spine nontender CV: S1/S2 noted, no murmurs/rubs/gallops noted LUNGS: Lungs are clear to auscultation bilaterally, no apparent distress ABDOMEN: soft, nontender, no rebound or guarding GU:no cva tenderness NEURO:Awake/alert, face symmetric, no arm or leg drift is noted Equal 5/5 strength with shoulder abduction, elbow flex/extension, wrist flex/extension in upper extremities and equal hand grips bilaterally Equal 5/5 strength with hip flexion,knee flex/extension, foot dorsi/plantar flexion Cranial nerves 3/4/5/6/02/08/09/11/12 tested and intact Gait normal without ataxia No past pointing Sensation to light touch intact in all extremities EXTREMITIES: pulses normal, full ROM, no calf edema.  No bruising, no deformities, no edema to upper or lower extremities.  Distal pulses are equal intact.  No discoloration of extremities. SKIN: warm, color normal, no rash noted to skin PSYCH: no abnormalities of mood noted, alert and oriented to  situation    ED Treatments / Results  Labs (all labs ordered are listed, but only abnormal results are displayed) Labs Reviewed  COMPREHENSIVE METABOLIC PANEL - Abnormal; Notable for the following components:      Result Value   Calcium 8.6 (*)    All other components within normal limits  CBC WITH DIFFERENTIAL/PLATELET  CK  URINALYSIS, ROUTINE W REFLEX MICROSCOPIC  PREGNANCY, URINE    EKG None  Radiology No results found.  Procedures Procedures   Medications Ordered in ED Medications  metoCLOPramide (REGLAN) injection 10 mg (10 mg Intravenous Given 03/16/18 0326)  ketorolac (TORADOL) 30 MG/ML injection 15 mg (15 mg Intravenous Given 03/16/18 0326)  diphenhydrAMINE (BENADRYL) injection 25 mg (25 mg Intravenous Given 03/16/18 0326)  sodium chloride 0.9 % bolus 1,000 mL (0 mLs Intravenous Stopped 03/16/18 0428)  sodium chloride 0.9 % bolus 1,000 mL (1,000 mLs Intravenous New Bag/Given 03/16/18 0446)     Initial Impression / Assessment and Plan / ED Course  I have reviewed the triage vital  signs and the nursing notes.  Pertinent labs results that were available during my care of the patient were reviewed by me and considered in my medical decision making (see chart for details).     4:10 AM Patient presents for multiple complaints.  In terms of her headache, similar to prior episodes, no focal neurologic deficits, will treat for headache, defer any further imaging.  Labs thus far reassuring.  She reports her myalgias/paresthesias have been getting worked up as an outpatient, and is waiting to see a rheumatologist. She will be appropriate for discharge home 6:05 AM Labs reassuring.  When asked about her headache if it was improved, reported " sure" She requests gabapentin for paresthesias, she reports she took twice daily previously, when asked if it helped previously she reported " sure" Patient slept in the ER for several hours, no acute distress noted.  Her vitals are  appropriate.  There are no signs of any acute neurologic emergency at this time. She reports her biggest concern was that her headache lasted longer than usual, with associated epistaxis.  She reports having all these symptoms previously.  I do not feel further work-up is required at this time. Final Clinical Impressions(s) / ED Diagnoses   Final diagnoses:  Other headache syndrome  Paresthesia  Myalgia    ED Discharge Orders         Ordered    gabapentin (NEURONTIN) 100 MG capsule  2 times daily     03/16/18 [id number]           Zadie RhineWickline, Sherylann Vangorden, MD 03/16/18 [phone]

## 2018-03-17 ENCOUNTER — Telehealth: Payer: Self-pay

## 2018-03-17 NOTE — Telephone Encounter (Signed)
Can't blame her for that. If she plans on establishing with them, please remove me as PCP. TY.

## 2018-03-17 NOTE — Telephone Encounter (Signed)
Follow up call made to patient regarding ED visit. 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

## 2018-04-20 ENCOUNTER — Encounter (HOSPITAL_COMMUNITY): Payer: Self-pay

## 2018-04-20 ENCOUNTER — Emergency Department (HOSPITAL_COMMUNITY)
Admission: EM | Admit: 2018-04-20 | Discharge: 2018-04-21 | Disposition: A | Discharge status: Home / Self Care | Payer: 59 | Attending: Emergency Medicine | Admitting: Emergency Medicine

## 2018-04-20 DIAGNOSIS — Z91040 Latex allergy status: Secondary | ICD-10-CM | POA: Insufficient documentation

## 2018-04-20 DIAGNOSIS — Z79899 Other long term (current) drug therapy: Secondary | ICD-10-CM | POA: Insufficient documentation

## 2018-04-20 DIAGNOSIS — F17210 Nicotine dependence, cigarettes, uncomplicated: Secondary | ICD-10-CM | POA: Diagnosis not present

## 2018-04-20 DIAGNOSIS — R002 Palpitations: Secondary | ICD-10-CM | POA: Insufficient documentation

## 2018-04-20 DIAGNOSIS — R55 Syncope and collapse: Secondary | ICD-10-CM | POA: Diagnosis present

## 2018-04-20 HISTORY — DX: Migraine, unspecified, not intractable, without status migrainosus: G43.909

## 2018-04-20 NOTE — ED Triage Notes (Signed)
Pt states that today she had some heart palpations today and then two syncopal episodes this afternoon. Denies injury from falling in shower today.

## 2018-04-21 ENCOUNTER — Encounter: Payer: Self-pay | Admitting: Medical

## 2018-04-21 ENCOUNTER — Ambulatory Visit (INDEPENDENT_AMBULATORY_CARE_PROVIDER_SITE_OTHER): Payer: 59 | Admitting: Medical

## 2018-04-21 VITALS — BP 108/79 | HR 114 | Temp 99.4°F | Resp 16 | Ht 69.0 in | Wt 190.0 lb

## 2018-04-21 DIAGNOSIS — R002 Palpitations: Secondary | ICD-10-CM | POA: Diagnosis not present

## 2018-04-21 DIAGNOSIS — R51 Headache: Secondary | ICD-10-CM

## 2018-04-21 DIAGNOSIS — R55 Syncope and collapse: Secondary | ICD-10-CM | POA: Diagnosis not present

## 2018-04-21 DIAGNOSIS — R519 Headache, unspecified: Secondary | ICD-10-CM

## 2018-04-21 DIAGNOSIS — Z8669 Personal history of other diseases of the nervous system and sense organs: Secondary | ICD-10-CM | POA: Diagnosis not present

## 2018-04-21 DIAGNOSIS — F419 Anxiety disorder, unspecified: Secondary | ICD-10-CM

## 2018-04-21 LAB — URINALYSIS, ROUTINE W REFLEX MICROSCOPIC
Bilirubin Urine: NEGATIVE
Glucose, UA: NEGATIVE mg/dL
Ketones, ur: NEGATIVE mg/dL
Leukocytes, UA: NEGATIVE
NITRITE: NEGATIVE
PROTEIN: 30 mg/dL — AB
RBC / HPF: >50 RBC/hpf — ABNORMAL HIGH (ref 0–5)
Specific Gravity, Urine: 1.026 (ref 1.005–1.030)
pH: 5 (ref 5.0–8.0)

## 2018-04-21 LAB — BASIC METABOLIC PANEL
ANION GAP: 10 (ref 5–15)
BUN: 5 mg/dL — AB (ref 6–20)
CO2: 24 mmol/L (ref 22–32)
Calcium: 9.1 mg/dL (ref 8.9–10.3)
Chloride: 105 mmol/L (ref 98–111)
Creatinine, Ser: 0.84 mg/dL (ref 0.44–1.00)
GFR calc Af Amer: >60 mL/min (ref >60)
Glucose, Bld: 105 mg/dL — ABNORMAL HIGH (ref 70–99)
POTASSIUM: 3.8 mmol/L (ref 3.5–5.1)
Sodium: 139 mmol/L (ref 135–145)

## 2018-04-21 LAB — CBC
HEMATOCRIT: 38.5 % (ref 36.0–46.0)
Hemoglobin: 12.5 g/dL (ref 12.0–15.0)
MCH: 31.7 pg (ref 26.0–34.0)
MCHC: 32.5 g/dL (ref 30.0–36.0)
MCV: 97.7 fL (ref 78.0–100.0)
Platelets: 232 10*3/uL (ref 150–400)
RBC: 3.94 MIL/uL (ref 3.87–5.11)
RDW: 11.9 % (ref 11.5–15.5)
WBC: 4.9 10*3/uL (ref 4.0–10.5)

## 2018-04-21 LAB — I-STAT BETA HCG BLOOD, ED (MC, WL, AP ONLY): I-stat hCG, quantitative: <5 m[IU]/mL (ref <5)

## 2018-04-21 MED ORDER — SODIUM CHLORIDE 0.9 % IV BOLUS
1000.0000 mL | Freq: Once | INTRAVENOUS | Status: AC
  Administered 2018-04-21: 1000 mL via INTRAVENOUS

## 2018-04-21 NOTE — Patient Instructions (Addendum)
For your recent syncopal episodes, recent palpitations, increased frequency of headaches and increased anxiety, I do want you to be evaluated by a cardiologist for potential Holter monitor.  I went ahead and put that referral in.  Also in light of the frequent headaches and the syncopal episode we will get a CT of head without contrast.  Possibility that we might refer you to a neurologist as well.  However I do think think with recent high level and of stress that these events may represent possible vasovagal episodes.  Also want you to make sure that you are staying well-hydrated.    In addition I want you to check your blood pressures daily particularly if you feel any lightheaded sensation would like to know what blood pressure reading are at that time.  Continue with current medications for anxiety and mood.  Referral staff should be contacting you probably by next week.  If no one calls you with a referral to cardiologist by Wednesday of next week please call here for update.  Also if no one calls  regarding CT of head please call as well.  Follow-up in 7 to 10 days or as needed.
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
## 2018-04-21 NOTE — ED Provider Notes (Signed)
[facility] EMERGENCY DEPARTMENT Provider Note   CSN: [id number] Arrival date & time: 04/20/18  2316     History   Chief Complaint Chief Complaint  Patient presents with  . Loss of Consciousness    HPI Crystal Bradley is a 32 y.o. female.  Patient presents to the emergency department with a chief complaint of syncopal episode.  She states that she was in the shower today and passed out.  She reports then passing out again later this afternoon.  She states that she has had some heart palpitations over the past 2 weeks.  She notices these when she lays down at night.  She states that she has been under a lot of stress.  She reports some changes in her medications, stating that she only takes Adderall as needed.  She does not take trazodone anymore.  She denies having any chest pain or shortness of breath.  She states that she still feels a little "off."  States that her cousin was working Holiday representative at around age 42 and collapsed and died.  The history is provided by the patient. No language interpreter was used.    Past Medical History:  Diagnosis Date  . Anemia   . Anxiety   . Hiatal hernia   . Migraine     Patient Active Problem List   Diagnosis Date Noted  . Vitamin D deficiency 11/03/2017  . GAD (generalized anxiety disorder) 08/27/2017    Past Surgical History:  Procedure Laterality Date  . EYE SURGERY       OB History   None      Home Medications    Prior to Admission medications   Medication Sig Start Date End Date Taking? Authorizing Provider  amphetamine-dextroamphetamine (ADDERALL) 20 MG tablet Take 20 mg by mouth daily.    [provider]  clonazePAM (KLONOPIN) 0.5 MG tablet TAKE 1 TABLET (0.5 MG TOTAL) BY MOUTH 2 (TWO) TIMES DAILY AS NEEDED FOR ANXIETY. 11/26/17   Wendling, Jilda Roche, DO  dexlansoprazole (DEXILANT) 60 MG capsule Take 60 mg by mouth daily.    [provider]  gabapentin (NEURONTIN) 100  MG capsule Take 1 capsule (100 mg total) by mouth 2 (two) times daily. 03/16/18   Zadie Rhine, MD  naproxen (EC NAPROSYN) 500 MG EC tablet Take 1 tab and repeat in 2 hours if headache not improved. 04/16/17   Sharlene Dory, DO  omeprazole (PRILOSEC) 40 MG capsule Take 1 capsule (40 mg total) by mouth daily. 08/17/17   Sharlene Dory, DO  sertraline (ZOLOFT) 50 MG tablet Take 1.5 tablets (75 mg total) by mouth daily. 09/24/17   Sharlene Dory, DO  traZODone (DESYREL) 50 MG tablet Take 0.5-1 tablets (25-50 mg total) by mouth at bedtime as needed for sleep. 07/23/17   Sharlene Dory, DO  Vitamin D, Ergocalciferol, (DRISDOL) 50000 units CAPS capsule Take 1 capsule (50,000 Units total) by mouth every 7 (seven) days. 11/03/17   Sharlene Dory, DO    Family History Family History  Problem Relation Age of Onset  . Cancer Mother        MM  . Breast cancer Maternal Aunt   . Autoimmune disease Neg Hx     Social History Social History   Tobacco Use  . Smoking status: Current Every Day Smoker    Packs/day: 0.50    Types: Cigarettes  . Smokeless tobacco: Never Used  Substance Use Topics  . Alcohol use: No  Comment: social  . Drug use: No     Allergies   Latex; Shellfish-derived products; Ativan [lorazepam]; Hydroxyzine; and Latex   Review of Systems Review of Systems  All other systems reviewed and are negative.    Physical Exam Updated Vital Signs BP 105/75 (BP Location: Right Arm)   Pulse 69   Temp 98.9 F (37.2 C) (Oral)   Resp 16   SpO2 99%   Physical Exam  Constitutional: She is oriented to person, place, and time. She appears well-developed and well-nourished.  HENT:  Head: Normocephalic and atraumatic.  Eyes: Pupils are equal, round, and reactive to light. Conjunctivae and EOM are normal.  Neck: Normal range of motion. Neck supple.  Cardiovascular: Normal rate and regular rhythm. Exam reveals no gallop and no friction  rub.  No murmur heard. Pulmonary/Chest: Effort normal and breath sounds normal. No respiratory distress. She has no wheezes. She has no rales. She exhibits no tenderness.  Abdominal: Soft. Bowel sounds are normal. She exhibits no distension and no mass. There is no tenderness. There is no rebound and no guarding.  Musculoskeletal: Normal range of motion. She exhibits no edema or tenderness.  Neurological: She is alert and oriented to person, place, and time.  Skin: Skin is warm and dry.  Psychiatric: She has a normal mood and affect. Her behavior is normal. Judgment and thought content normal.  Nursing note and vitals reviewed.    ED Treatments / Results  Labs (all labs ordered are listed, but only abnormal results are displayed) Labs Reviewed  BASIC METABOLIC PANEL - Abnormal; Notable for the following components:      Result Value   Glucose, Bld 105 (*)    BUN 5 (*)    All other components within normal limits  URINALYSIS, ROUTINE W REFLEX MICROSCOPIC - Abnormal; Notable for the following components:   Color, Urine AMBER (*)    APPearance HAZY (*)    Hgb urine dipstick LARGE (*)    Protein, ur 30 (*)    RBC / HPF >50 (*)    Bacteria, UA RARE (*)    All other components within normal limits  CBC  I-STAT BETA HCG BLOOD, ED ([facility], [facility], [facility] ONLY)    EKG EKG Interpretation  Date/Time:  Wednesday April 20 2018 23:58:16 EDT Ventricular Rate:  83 PR Interval:  158 QRS Duration: 82 QT Interval:  374 QTC Calculation: 439 R Axis:   59 Text Interpretation:  Normal sinus rhythm Nonspecific ST and T wave abnormality Abnormal ECG No old tracing to compare Confirmed by Drema Pryardama, Pedro [phone]([id number]) on 04/21/2018 2:02:01 AM   Radiology No results found.  Procedures Procedures (including critical care time)  Medications Ordered in ED Medications  sodium chloride 0.9 % bolus 1,000 mL (has no administration in time range)     Initial Impression / Assessment and Plan / ED Course  I  have reviewed the triage vital signs and the nursing notes.  Pertinent labs & imaging results that were available during my care of the patient were reviewed by me and considered in my medical decision making (see chart for details).     She with 2 syncopal episodes yesterday.  First was in the morning while showering.  Second was in the afternoon.  Her blood pressure is noted to be a little low.  Will check orthostatic vital signs.  She is not tachypneic nor tachycardic nor hypoxic, doubt PE.  She is not pregnant.  Cousin collapsed and died at age 34.  EKG reviewed with Dr. Eudelia Bunch, no evidence for HOCM, [place], or other concerning EKG findings.  Given fluids in ED.  Thought to be orthostasis vs vasovagul.  PCP follow-up.   Final Clinical Impressions(s) / ED Diagnoses   Final diagnoses:  Syncope, unspecified syncope type    ED Discharge Orders    None       Roxy Horseman, PA-C 04/21/18 0426    Nira Conn, MD 04/21/18 [phone]

## 2018-04-28 ENCOUNTER — Telehealth: Payer: Self-pay

## 2018-04-28 NOTE — Telephone Encounter (Signed)
Pt states she is still feeling dizzy but has not passed out. Pt states she can get CT free through her job so she will go ahead and let them do it. Pt BP has still been low last reading 88/58.

## 2018-04-29 NOTE — Telephone Encounter (Signed)
Will you ask her to go ahead and get the CT of HEAD done and have them send me the result. 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 Ask her if she can investigate process/get me the number.

## 2018-05-03 NOTE — Telephone Encounter (Signed)
Pt states she doesn't  need an order to get CT done at her job.

## 2018-05-17 ENCOUNTER — Ambulatory Visit: Payer: 59 | Admitting: Cardiology

## 2018-05-19 ENCOUNTER — Ambulatory Visit: Payer: 59 | Admitting: Cardiology

## 2018-05-29 ENCOUNTER — Encounter (HOSPITAL_COMMUNITY): Payer: Self-pay | Admitting: Emergency Medicine

## 2018-05-29 ENCOUNTER — Emergency Department (HOSPITAL_COMMUNITY)
Admission: EM | Admit: 2018-05-29 | Discharge: 2018-05-29 | Disposition: A | Discharge status: Home / Self Care | Payer: 59 | Attending: Emergency Medicine | Admitting: Emergency Medicine

## 2018-05-29 ENCOUNTER — Other Ambulatory Visit: Payer: Self-pay

## 2018-05-29 DIAGNOSIS — R05 Cough: Secondary | ICD-10-CM | POA: Insufficient documentation

## 2018-05-29 DIAGNOSIS — R002 Palpitations: Secondary | ICD-10-CM | POA: Diagnosis not present

## 2018-05-29 DIAGNOSIS — R0602 Shortness of breath: Secondary | ICD-10-CM | POA: Insufficient documentation

## 2018-05-29 DIAGNOSIS — Z5321 Procedure and treatment not carried out due to patient leaving prior to being seen by health care provider: Secondary | ICD-10-CM | POA: Insufficient documentation

## 2018-05-29 NOTE — ED Notes (Signed)
Upon phlebotomist Tyrone attempting to get blood, patient became agitated again stating that we couldn't even get her name right here and that she was leaving. Staff apologized to patient and attempted to fix her name but patient began cursing and stated she was leaving.

## 2018-05-29 NOTE — ED Notes (Signed)
Patient got agitated during triage, stating that she wanted her daughter seen, pt was advised her daughter would need to be seen on the pediatric side after we triaged her (the patient). Patient got very defensive and stated that she was told her and her daughter could be seen together. Patient also became very agitated while staff was attempting to obtain vital signs

## 2018-05-29 NOTE — ED Triage Notes (Addendum)
Pt reports cough, chest heaviness, sob and palpitations for "some time" states she has been taking different otc meds w/o relief. Denies fevers. Pt very anxious in triage stating her daughter has also been very sick, resp e/u, nad.

## 2018-09-12 ENCOUNTER — Emergency Department (HOSPITAL_COMMUNITY)
Admission: EM | Admit: 2018-09-12 | Discharge: 2018-09-12 | Disposition: A | Discharge status: Other Acute Inpatient Hospital | Payer: 59 | Attending: Emergency Medicine | Admitting: Emergency Medicine

## 2018-09-12 ENCOUNTER — Encounter (HOSPITAL_COMMUNITY): Payer: Self-pay

## 2018-09-12 ENCOUNTER — Encounter (HOSPITAL_COMMUNITY): Payer: Self-pay | Admitting: *Deleted

## 2018-09-12 ENCOUNTER — Other Ambulatory Visit: Payer: Self-pay

## 2018-09-12 ENCOUNTER — Inpatient Hospital Stay (HOSPITAL_COMMUNITY)
Admission: AD | Admit: 2018-09-12 | Discharge: 2018-09-13 | DRG: 885 | Disposition: A | Discharge status: Home / Self Care | Payer: Federal, State, Local not specified - Other | Source: Intra-hospital | Attending: Psychiatry | Admitting: Psychiatry

## 2018-09-12 DIAGNOSIS — Z91040 Latex allergy status: Secondary | ICD-10-CM

## 2018-09-12 DIAGNOSIS — F17210 Nicotine dependence, cigarettes, uncomplicated: Secondary | ICD-10-CM | POA: Insufficient documentation

## 2018-09-12 DIAGNOSIS — F323 Major depressive disorder, single episode, severe with psychotic features: Principal | ICD-10-CM | POA: Diagnosis present

## 2018-09-12 DIAGNOSIS — R45851 Suicidal ideations: Secondary | ICD-10-CM | POA: Diagnosis present

## 2018-09-12 DIAGNOSIS — Z91013 Allergy to seafood: Secondary | ICD-10-CM | POA: Diagnosis not present

## 2018-09-12 DIAGNOSIS — F333 Major depressive disorder, recurrent, severe with psychotic symptoms: Secondary | ICD-10-CM | POA: Insufficient documentation

## 2018-09-12 DIAGNOSIS — Z79899 Other long term (current) drug therapy: Secondary | ICD-10-CM

## 2018-09-12 DIAGNOSIS — Z888 Allergy status to other drugs, medicaments and biological substances status: Secondary | ICD-10-CM

## 2018-09-12 DIAGNOSIS — F419 Anxiety disorder, unspecified: Secondary | ICD-10-CM | POA: Diagnosis present

## 2018-09-12 DIAGNOSIS — T50904A Poisoning by unspecified drugs, medicaments and biological substances, undetermined, initial encounter: Secondary | ICD-10-CM | POA: Insufficient documentation

## 2018-09-12 LAB — COMPREHENSIVE METABOLIC PANEL
ALT: 17 U/L (ref 0–44)
ANION GAP: 8 (ref 5–15)
AST: 18 U/L (ref 15–41)
Albumin: 4.3 g/dL (ref 3.5–5.0)
Alkaline Phosphatase: 46 U/L (ref 38–126)
BILIRUBIN TOTAL: 1.1 mg/dL (ref 0.3–1.2)
BUN: 7 mg/dL (ref 6–20)
CO2: 25 mmol/L (ref 22–32)
Calcium: 8.9 mg/dL (ref 8.9–10.3)
Chloride: 107 mmol/L (ref 98–111)
Creatinine, Ser: 0.81 mg/dL (ref 0.44–1.00)
GFR calc Af Amer: >60 mL/min (ref >60)
GLUCOSE: 83 mg/dL (ref 70–99)
POTASSIUM: 3.6 mmol/L (ref 3.5–5.1)
Sodium: 140 mmol/L (ref 135–145)
Total Protein: 7.9 g/dL (ref 6.5–8.1)

## 2018-09-12 LAB — CBC
HCT: 39.6 % (ref 36.0–46.0)
HEMOGLOBIN: 12.4 g/dL (ref 12.0–15.0)
MCH: 30.7 pg (ref 26.0–34.0)
MCHC: 31.3 g/dL (ref 30.0–36.0)
MCV: 98 fL (ref 80.0–100.0)
Platelets: 258 10*3/uL (ref 150–400)
RBC: 4.04 MIL/uL (ref 3.87–5.11)
RDW: 12.4 % (ref 11.5–15.5)
WBC: 4.2 10*3/uL (ref 4.0–10.5)
nRBC: 0 % (ref 0.0–0.2)

## 2018-09-12 LAB — RAPID URINE DRUG SCREEN, HOSP PERFORMED
AMPHETAMINES: NOT DETECTED
Barbiturates: NOT DETECTED
Benzodiazepines: POSITIVE — AB
COCAINE: NOT DETECTED
OPIATES: NOT DETECTED
TETRAHYDROCANNABINOL: POSITIVE — AB

## 2018-09-12 LAB — CBG MONITORING, ED
Glucose-Capillary: 66 mg/dL — ABNORMAL LOW (ref 70–99)
Glucose-Capillary: 72 mg/dL (ref 70–99)

## 2018-09-12 LAB — SALICYLATE LEVEL: Salicylate Lvl: <7 mg/dL (ref 2.8–30.0)

## 2018-09-12 LAB — I-STAT BETA HCG BLOOD, ED (MC, WL, AP ONLY): I-stat hCG, quantitative: <5 m[IU]/mL (ref <5)

## 2018-09-12 LAB — ACETAMINOPHEN LEVEL

## 2018-09-12 LAB — ETHANOL: Alcohol, Ethyl (B): 15 mg/dL — ABNORMAL HIGH (ref <10)

## 2018-09-12 MED ORDER — STERILE WATER FOR INJECTION IJ SOLN
INTRAMUSCULAR | Status: AC
  Filled 2018-09-12: qty 10

## 2018-09-12 MED ORDER — ZIPRASIDONE MESYLATE 20 MG IM SOLR
20.0000 mg | Freq: Once | INTRAMUSCULAR | Status: AC
  Administered 2018-09-12: 20 mg via INTRAMUSCULAR

## 2018-09-12 MED ORDER — ZIPRASIDONE MESYLATE 20 MG IM SOLR
INTRAMUSCULAR | Status: AC
  Administered 2018-09-12: 20 mg via INTRAMUSCULAR
  Filled 2018-09-12: qty 20

## 2018-09-12 MED ORDER — ACETAMINOPHEN 325 MG PO TABS: 650.0000 mg | ORAL_TABLET | ORAL | Status: DC | PRN

## 2018-09-12 MED ORDER — HALOPERIDOL 2 MG PO TABS
2.0000 mg | ORAL_TABLET | Freq: Two times a day (BID) | ORAL | Status: DC
  Filled 2018-09-12 (×2): qty 1

## 2018-09-12 NOTE — ED Triage Notes (Addendum)
When this RN asked the patient how she feels, she states "I feel fine, I just want to go home." But when asked if she took the medications to take the edge off of her anxiety or if she was attempting to kill herself. The patient responded "both. I just dont want to wake up." When patient asked to clarify, she says "I dont want to say this because I am going to put the nail in my coffin, but I just dont want to wake up. My daughter would be better off without me."  Patient very tearful and crying. Patient reports she has checked herself into [facility] before, and was prescribed medications, but states she does not take the medications every day since she cannot afford them. Patient reports she works full time, but cannot afford her rent, bills, and medications. Patient concerned she is going to lose her child. Patient reports her child's father is not involved in the chlid's life. MD Rhunette CroftNanavati made aware.

## 2018-09-12 NOTE — ED Notes (Signed)
Patient transferred from triage 8 via a recliner.  Patient is calm with eyes closed.  Covered her face with the blanket.  Breathing is regular and unlabored.  Patient is safe on the unit.

## 2018-09-12 NOTE — ED Notes (Addendum)
Patient made a phone call with the phone in her room to her friend who brought her in. According to patient, the friend picked her daughter up from school and told her daughter that the patient was in the hospital. Patient became irate and agitated and was screaming into the phone when this nurse entered the room. Patient asked me to speak with the man on the other side of the phone. This RN told the man the patient was IVC'd and that she needed to speak with a psychiatrist prior to a plan of care being made. When asked by me, with the sitter in the room, the patient verbalized that the ex-boyfriend on the phone "Ladona Ridgel" [phone]) could keep her daughter until the patient was released. Patient very tearful and angry and started escalating, at which point, this RN hung up the phone and tried to verbally de-escalate the patient. Patient became physically aggressive and stood up and tried to leave the room. [facility] called and security tried to verbally de-escalate the patient. Dr Rhunette Croft called into room, and tried to verbally deescalate the patient.

## 2018-09-12 NOTE — ED Notes (Signed)
Bed: [facility] Expected date:  Expected time:  Means of arrival:  Comments: 

## 2018-09-12 NOTE — ED Triage Notes (Signed)
Patient presents with a drug overdose. Patient reports "I was just feeling anxiety and I was overwhelmed." Patient reports drinking [place] in conjunction with taking "4 pills" in a bottle listed as Robaxin 500mg , but patient states there was xanax in the bottle. Patient states, " I usually take 2mg  clonopin per day, which is basically the equivalent of what I took in that bottle." Patient denies suicidal ideation/homicidal ideation. Patient presents intoxicated, but alert. Patient reports chest pain.

## 2018-09-12 NOTE — ED Notes (Signed)
Patient monitored for 1.5 hours post Geodon administration. Patient arousable to voice, breathing easy and unlabored, oxygen saturations 100% on RA, and able to obey commands and to move herself over to new bed. Patient transported to psych ED for further evaluation and treatment.

## 2018-09-12 NOTE — ED Notes (Signed)
Sitter at bedside. TTS at bedside.

## 2018-09-12 NOTE — ED Provider Notes (Signed)
[facility]   CSN: [id number] Arrival date & time: 09/12/18  1104     History   Chief Complaint Chief Complaint  Patient presents with  . Ingestion    HPI Crystal Bradley is a 33 y.o. female.  HPI 33 year old female comes to the ER with chief complaint of ingestion.  Patient has history of anxiety.  Patient reports that she has been feeling overwhelmed and having severe anxiety -therefore she took anxiety medications and muscle relaxants along with alcohol to relax.  When further asked what her intention was, she states that " the world would be better without her", " that her daughter would be better off without her".  Patient reports that she is having difficulty supporting herself and paying her bills, therefore she has been noncompliant with her medications.  Recently she has been having extramarital affair with another man who was supporting her, and that individual broke up with her today.  Pt denies nausea, emesis, fevers, chills, chest pains, shortness of breath, headaches, abdominal pain, uti like symptoms.  Past Medical History:  Diagnosis Date  . Anemia   . Anxiety   . Hiatal hernia   . Migraine     Patient Active Problem List   Diagnosis Date Noted  . Vitamin D deficiency 11/03/2017  . GAD (generalized anxiety disorder) 08/27/2017    Past Surgical History:  Procedure Laterality Date  . EYE SURGERY       OB History   No obstetric history on file.      Home Medications    Prior to Admission medications   Medication Sig Start Date End Date Taking? Authorizing Provider  clonazePAM (KLONOPIN) 0.5 MG tablet TAKE 1 TABLET (0.5 MG TOTAL) BY MOUTH 2 (TWO) TIMES DAILY AS NEEDED FOR ANXIETY. 11/26/17  Yes Wendling, Jilda Roche, DO  methocarbamol (ROBAXIN) 500 MG tablet Take 1,000 mg by mouth 2 (two) times daily as needed for muscle spasms.    Yes [provider]  predniSONE (DELTASONE) 10 MG tablet  Take 10 mg by mouth daily with breakfast.    Yes [provider]  amphetamine-dextroamphetamine (ADDERALL) 20 MG tablet Take 10 mg by mouth 3 (three) times daily.     [provider]  gabapentin (NEURONTIN) 100 MG capsule Take 1 capsule (100 mg total) by mouth 2 (two) times daily. Patient taking differently: Take 100 mg by mouth at bedtime.  03/16/18   Zadie Rhine, MD  naproxen (EC NAPROSYN) 500 MG EC tablet Take 1 tab and repeat in 2 hours if headache not improved. 04/16/17   Sharlene Dory, DO  omeprazole (PRILOSEC) 40 MG capsule Take 1 capsule (40 mg total) by mouth daily. 08/17/17   Sharlene Dory, DO  phentermine 37.5 MG capsule Take 37.5 mg by mouth every morning.    [provider]  sertraline (ZOLOFT) 50 MG tablet Take 50 mg by mouth daily.    [provider]    Family History Family History  Problem Relation Age of Onset  . Cancer Mother        MM  . Breast cancer Maternal Aunt   . Autoimmune disease Neg Hx     Social History Social History   Tobacco Use  . Smoking status: Current Every Day Smoker    Packs/day: 0.50    Types: Cigarettes  . Smokeless tobacco: Never Used  Substance Use Topics  . Alcohol use: No    Comment: social  . Drug use:  No     Allergies   Latex; Shellfish-derived products; Ativan [lorazepam]; Hydroxyzine; and Latex   Review of Systems Review of Systems  Constitutional: Positive for activity change.  Cardiovascular: Negative for chest pain.  Gastrointestinal: Negative for nausea and vomiting.  Psychiatric/Behavioral: Positive for sleep disturbance. The patient is nervous/anxious.      Physical Exam Updated Vital Signs BP 102/62   Pulse 83   Temp 98.6 F (37 C) (Oral)   Resp (!) 24   Ht 5\' 9"  (1.753 m)   LMP 09/12/2018   SpO2 95%   BMI 28.06 kg/m   Physical Exam Vitals signs and nursing note reviewed.  Constitutional:      Appearance: She is well-developed.  HENT:      Head: Normocephalic and atraumatic.  Neck:     Musculoskeletal: Normal range of motion and neck supple.  Cardiovascular:     Rate and Rhythm: Normal rate.  Pulmonary:     Effort: Pulmonary effort is normal.  Abdominal:     General: Bowel sounds are normal.  Skin:    General: Skin is warm and dry.  Neurological:     Mental Status: She is alert and oriented to person, place, and time.  Psychiatric:     Comments: Abnormal judgment, emotionally labile.      ED Treatments / Results  Labs (all labs ordered are listed, but only abnormal results are displayed) Labs Reviewed  ETHANOL - Abnormal; Notable for the following components:      Result Value   Alcohol, Ethyl (B) 15 (*)    All other components within normal limits  ACETAMINOPHEN LEVEL - Abnormal; Notable for the following components:   Acetaminophen (Tylenol), Serum <10 (*)    All other components within normal limits  RAPID URINE DRUG SCREEN, HOSP PERFORMED - Abnormal; Notable for the following components:   Benzodiazepines POSITIVE (*)    Tetrahydrocannabinol POSITIVE (*)    All other components within normal limits  CBG MONITORING, ED - Abnormal; Notable for the following components:   Glucose-Capillary 66 (*)    All other components within normal limits  COMPREHENSIVE METABOLIC PANEL  SALICYLATE LEVEL  CBC  I-STAT BETA HCG BLOOD, ED ([facility], [facility], [facility] ONLY)  CBG MONITORING, ED    EKG EKG Interpretation  Date/Time:  Monday September 12 2018 11:19:55 EST Ventricular Rate:  111 PR Interval:    QRS Duration: 79 QT Interval:  309 QTC Calculation: 420 R Axis:   48 Text Interpretation:  Sinus tachycardia Probable left atrial enlargement No acute changes Nonspecific ST and T wave abnormality Confirmed by Derwood Kaplan ([id number]) on 09/12/2018 3:11:36 PM   Radiology No results found.  Procedures .Critical Care Performed by: Derwood Kaplan, MD Authorized by: Derwood Kaplan, MD   Critical care provider statement:     Critical care time (minutes):  45   Critical care was necessary to treat or prevent imminent or life-threatening deterioration of the following conditions:  Toxidrome and CNS failure or compromise   Critical care was time spent personally by me on the following activities:  Discussions with consultants, evaluation of patient's response to treatment, examination of patient, ordering and performing treatments and interventions, ordering and review of laboratory studies, ordering and review of radiographic studies, pulse oximetry, re-evaluation of patient's condition, obtaining history from patient or surrogate and review of old charts   (including critical care time)  Medications Ordered in ED Medications  acetaminophen (TYLENOL) tablet 650 mg (has no administration in time range)  haloperidol (  HALDOL) tablet 2 mg (has no administration in time range)  sterile water (preservative free) injection (has no administration in time range)  ziprasidone (GEODON) injection 20 mg (20 mg Intramuscular Given 09/12/18 1341)     Initial Impression / Assessment and Plan / ED Course  I have reviewed the triage vital signs and the nursing notes.  Pertinent labs & imaging results that were available during my care of the patient were reviewed by me and considered in my medical decision making (see chart for details).  Clinical Course as of Sep 12 1516  Mon Sep 12, 2018  0150 Around 130 patient started getting aggressive and hostile.  The bedside nurse had been kindly requesting patient to await the psychiatry evaluation.  I had already informed the patient that we had called psychiatry and asked them to see her this afternoon despite the psychiatry team being done for the day.   I went to calm the patient down, however the matter already escalated at that point.  There were multiple security guard outside patient's room, trying to kindly ask her to staff in the room.  The nurse was also requesting that patient  sit in her bed.  I tried to reason with the patient and asked her to sit with us in the room and talk about what we are doing and why we are doing it -however patient tried to run out of the room.  Security had to hold her and physically restrain her.  We gave her IM Geodon as patient continued to be hostile towards our staff.  I repeatedly tried to ask patient to cooperate so that psychiatry team can get her assessed and hopefully discharge her if she demonstrates appropriate behavior.  None of our verbal de-escalation worked, as patient continued to be hostile.    [AN]  1517 Patient reassessed few minutes ago.  She was asleep.  On monitor she was not hypoxic.  There was no respiratory depression.  Patient is medically cleared for psych eval.   [AN]    Clinical Course User Index [AN] Derwood KaplanNanavati, Lovinia Snare, MD    33 year old comes in a chief complaint of overdose.  It appears that patient has history of anxiety, she also has a lot of increased stress at home.  Patient overdosed on Robaxin and Klonopin earlier today.  She was brought here by the ex-boyfriend for further evaluation.  Patient is noted to be emotionally labile.  She is tearful and resenting her situation.  We had a long conversation, and patient repeatedly reported to me that the world would be better off without her, and that that daughter would be better off without her.  She is demonstrating poor judgment.  I repeatedly tried to inform patient that her daughter would not be better off without the mother, but patient continued to feel that way given that the biological father is well off and living in [place].  Ultimately it seems like patient is having difficulty with coping and her response to the break-up that she underwent earlier was excessive and dangerous.  Psych has been consulted to help with contract for safety. Patient has been explained the procedure.  Final Clinical Impressions(s) / ED Diagnoses   Final diagnoses:  Drug  overdose, undetermined intent, initial encounter    ED Discharge Orders    None       Derwood KaplanNanavati, Jakobe Blau, MD 09/12/18 [phone]

## 2018-09-12 NOTE — ED Notes (Addendum)
Patient dressed out in maroon scrubs. Patient kept her underwear on due to being on her period. Patient clothes and nipple rings at nursing station. Patient's phone was taken home by the friend that brought the patient in, per the patient. Patient updated on IVC status and that she is not safe to go home at this time. Patient very tearful and has tried to walk out twice, saying that she is "fine." Patient redirectable at this time.  Patient given [place] sandwich due to low blood sugar.

## 2018-09-12 NOTE — ED Notes (Signed)
Bed: [facility] Expected date:  Expected time:  Means of arrival:  Comments: 

## 2018-09-12 NOTE — Progress Notes (Signed)
CSW spoke with Onalee Hua from TTS and as informed that pt has a 35 year old daughter who pt usually picks up from school per pt. CSW was informed that Earlene Plater has already spoken with pt to see if there is any other person that can pick pt up from school. Per Earlene Plater there is not. CSW unabel to speak face to face with pt as pt is at another ED. CSW reached out to pt's RN Tabitha to ask that she ask pt who can CSW call to pick daughter up from school or to see if CSW could be given a number for school in which daughter goes to to give update as needed before filing a report  CSW was informed by [place] that she would have RN call CSW back with that information.      Claude Manges Rozell Kettlewell, MSW, LCSW-A Emergency Department Clinical Social Worker [phone]

## 2018-09-12 NOTE — BH Assessment (Signed)
Assessment Note  Crystal Bradley is an 33 y.o. female that presents this date actively impaired with altered mental state. Patient admits to passive S/I although is vague in reference to plan or intent. Patient states she ingested several medications earlier this date to "just go to sleep for a long time." Patient declines to render any information in reference to what medication/s she ingested or where she obtained them from. Patient states "I already said to much earlier." Patient renders limited history with mood incongruent with affect. Patient is laughing inappropriately at times, although reports she is very angry and is demanding to leave. Patient was evaluated by Crystal Bradley on admission, who is in the process of initiating a IVC. Patient reports she was diagnosed with depression and anxiety "years ago" although renders limited information in reference to mental health history. Patent is declining to participate in the assessment stating "she isn't saying anything" due to "being incriminated." Patient admits to active AVH stating "yea I hear and see things" although will not elaborate on content. Patient is observed appears to be  Patient denies any SA use although UDS is positive for Benzodiazepines and THC this date. BAL was noted at 15. Patient has a history of alcohol use per chart review although patient denies. Information to complete assessment was obtained from admission notes and history. Per notes, patient presents with a drug overdose. Patient reports "I was just feeling anxiety and I was overwhelmed." Patient reports drinking [place] in conjunction with taking "4 pills" in a bottle listed as Robaxin 500mg , but patient states there was xanax in the bottle. Patient states, " I usually take 2mg  Klonopin per day, which is basically the equivalent of what I took in that bottle." Patient reports she currently resides with her 51 year old daughter. Patient states she "needs to pick her daughter  up at the bus stop at 3:00 p.m. Patient states "that is why she needs to leave" and reports she doesn't have anyone else to pick up child. This Clinical research associate contacted on call social worker to report. Status pending. Patient denies suicidal ideation/homicidal ideation. Patient presents intoxicated. Patient very tearful and has tried to walk out twice, saying that she is "fine." Case was staffed with Crystal Bradley who recommended a inpatient admission to assist with stabilization.    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 Anemia   . Anxiety   . Hiatal hernia   . Migraine     Past Surgical History:  Procedure Laterality Date  . EYE SURGERY      Family History:  Family History  Problem Relation Age of Onset  . Cancer Mother        MM  . Breast cancer Maternal Aunt   . Autoimmune disease Neg Hx     Social History:  reports that she has been smoking cigarettes. She has been smoking about 0.50 packs per day. She has never used smokeless tobacco. She reports that she does not drink alcohol or use drugs.  Additional Social History:  Alcohol / Drug Use Pain Medications: See MAR Prescriptions: See MAR Over the Counter: See MAR History of alcohol / drug use?: Yes Longest period of sobriety (when/how long): Unknown Negative Consequences of Use: (Denies) Withdrawal Symptoms: (Denies) Substance #1 Name of Substance 1: Alcohol per hx 1 - Age of First Use: 19 1 - Amount (size/oz): Varies 1 - Frequency: Varies  1 - Duration: Ongoing 1 - Last Use / Amount: Unknown pt declines to answer  CIWA: CIWA-Ar BP: 117/66 Pulse Rate: 84 COWS:    Allergies:  Allergies  Allergen Reactions  . Latex Swelling  . Shellfish-Derived Products Swelling  . Ativan [Lorazepam]     Adverse reactions/ agitation  . Hydroxyzine     Adverse reactions/agitation  . Latex     Home Medications: (Not in a  hospital admission)   OB/GYN Status:  Patient's last menstrual period was 09/12/2018.  General Assessment Data [facility] of [facility]: [facility] TTS Assessment: In system Is this a Tele or Face-to-Face Assessment?: Face-to-Face Is this an Initial Assessment or a Re-assessment for this encounter?: Initial Assessment Patient Accompanied by:: (NA) Language Other than English: No Living Arrangements: Other (Comment)(With child) What gender do you identify as?: Female Marital status: Single Maiden name: Manson PasseyBrown Pregnancy Status: No Living Arrangements: Children Can pt return to current living arrangement?: Yes Admission Status: Involuntary Petitioner: ED Attending Is patient capable of signing voluntary admission?: Yes Referral Source: Self/Family/Friend Insurance type: Cigna     Crisis Care Plan Living Arrangements: Children Legal Guardian: (NA) Name of Psychiatrist: Toni ArthursFuller Bradley Name of Therapist: None  Education Status Is patient currently in school?: No Is the patient employed, unemployed or receiving disability?: Employed  Risk to self with the past 6 months Suicidal Ideation: Yes-Currently Present Has patient been a risk to self within the past 6 months prior to admission? : No Suicidal Intent: No Has patient had any suicidal intent within the past 6 months prior to admission? : No Is patient at risk for suicide?: Yes Suicidal Plan?: No Has patient had any suicidal plan within the past 6 months prior to admission? : No Access to Means: No What has been your use of drugs/alcohol within the last 12 months?: Current use Previous Attempts/Gestures: (UTA) How many times?: (UTA) Other Self Harm Risks: (UTA) Triggers for Past Attempts: (UTA) Intentional Self Injurious Behavior: (UTA) Family Suicide History: No Recent stressful life event(s): (UTA) Persecutory voices/beliefs?: No Depression: (UTA) Depression Symptoms: (UTA) Substance abuse history and/or treatment for  substance abuse?: No Suicide prevention information given to non-admitted patients: Not applicable  Risk to Others within the past 6 months Homicidal Ideation: No Does patient have any lifetime risk of violence toward others beyond the six months prior to admission? : No Thoughts of Harm to Others: No Current Homicidal Intent: No Current Homicidal Plan: No Access to Homicidal Means: No Identified Victim: NA History of harm to others?: No Assessment of Violence: None Noted Violent Behavior Description: NA Does patient have access to weapons?: No Criminal Charges Pending?: No Does patient have a court date: No Is patient on probation?: No  Psychosis Hallucinations: Auditory, Visual Delusions: None noted  Mental Status Report Appearance/Hygiene: In scrubs Eye Contact: Fair Motor Activity: Agitation Speech: Slurred Level of Consciousness: Irritable Mood: Preoccupied Affect: Angry Anxiety Level: Moderate Thought Processes: Thought Blocking Judgement: Impaired Orientation: (UTA) Obsessive Compulsive Thoughts/Behaviors: None  Cognitive Functioning Concentration: Decreased Memory: (UTA) Is patient IDD: No Insight: Unable to Assess Impulse Control: Unable to Assess Appetite: (UTA) Have you had any weight changes? : (UTA) Sleep: (UTA) Total Hours of Sleep: (UTA) Vegetative Symptoms: None  ADLScreening [facility] Assessment Services) Patient's cognitive ability adequate to safely complete daily activities?: Yes Patient able to express need for assistance with ADLs?: Yes Independently performs ADLs?: Yes (appropriate for developmental age)  Prior Inpatient Therapy Prior Inpatient Therapy: No  Prior Outpatient Therapy Prior Outpatient Therapy: Yes  Prior Therapy Dates: Ongoing Prior Therapy Facilty/Provider(s): Pt cannot recall Reason for Treatment: Med mang Does patient have an ACCT team?: No Does patient have Intensive In-House Services?  : No Does patient have [facility]  services? : Unknown Does patient have P4CC services?: No  ADL Screening (condition at time of admission) Patient's cognitive ability adequate to safely complete daily activities?: Yes Is the patient deaf or have difficulty hearing?: No Does the patient have difficulty seeing, even when wearing glasses/contacts?: No Does the patient have difficulty concentrating, remembering, or making decisions?: No Patient able to express need for assistance with ADLs?: Yes Does the patient have difficulty dressing or bathing?: No Independently performs ADLs?: Yes (appropriate for developmental age) Does the patient have difficulty walking or climbing stairs?: No Weakness of Legs: None Weakness of Arms/Hands: None  Home Assistive Devices/Equipment Home Assistive Devices/Equipment: None  Therapy Consults (therapy consults require a physician order) PT Evaluation Needed: No OT Evalulation Needed: No SLP Evaluation Needed: No Abuse/Neglect Assessment (Assessment to be complete while patient is alone) Physical Abuse: Denies Verbal Abuse: Denies Sexual Abuse: Denies Exploitation of patient/patient's resources: Denies Self-Neglect: Denies Values / Beliefs Cultural Requests During Hospitalization: None Spiritual Requests During Hospitalization: None Consults Spiritual Care Consult Needed: No Social Work Consult Needed: No Merchant navy officer (For Healthcare) Does Patient Have a Medical Advance Directive?: No Would patient like information on creating a medical advance directive?: No - Patient declined          Disposition: Case was staffed with Crystal Bradley who recommended a inpatient admission to assist with stabilization.    Disposition Initial Assessment Completed for this Encounter: Yes Disposition of Patient: Admit Type of inpatient treatment program: Adult Patient refused recommended treatment: Yes Type of treatment offered and refused: In-patient Other disposition(s): (Adult  admission) Mode of transportation if patient is discharged/movement?: (Unk)  On Site Evaluation by:   Reviewed with Physician:    Alfredia Ferguson 09/12/2018 1:38 PM

## 2018-09-12 NOTE — BH Assessment (Signed)
BHH Assessment Progress Note  Per Nanine Means, DNP, this pt requires psychiatric hospitalization.  Lamount Cranker, RN, [facility] has assigned pt to [facility] Rm 507-2.  Pt presents under IVC initiated by EDP Derwood Kaplan, MD, and IVC documents have been faxed to [facility].  Pt's nurse, Lanora Manis, has been notified, and agrees to call report to [phone].  Pt is to be transported via Patent examiner.   Doylene Canning, [place] Behavioral Health Coordinator [phone]
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
## 2018-09-12 NOTE — BH Assessment (Signed)
BHH Assessment Progress Note Case was staffed with Lord DNP who recommended a inpatient admission to assist with stabilization.       

## 2018-09-12 NOTE — ED Notes (Signed)
Bed: [facility] Expected date:  Expected time:  Means of arrival:  Comments: Rm 8 @ 1500

## 2018-09-12 NOTE — Progress Notes (Signed)
D: Pt denies SI/HI/AVH. Pt been in room majority of the evening.  A: Pt was offered support and encouragement. Pt was given scheduled medications. Pt was encourage to attend groups. Q 15 minute checks were done for safety.  R: safety maintained on unit.  Problem: Activity: Goal: Sleeping patterns will improve Outcome: Progressing

## 2018-09-12 NOTE — Tx Team (Signed)
Initial Treatment Plan 09/12/2018 6:35 PM Glee Arvin Kedra Tamas [id number]    PATIENT STRESSORS: Marital or family conflict   PATIENT STRENGTHS: Ability for insight Average or above average intelligence Capable of independent living General fund of knowledge   PATIENT IDENTIFIED PROBLEMS: Depression  Suicidal thoughts "If I was suicidal I would have done it by now"                     DISCHARGE CRITERIA:  Ability to meet basic life and health needs Improved stabilization in mood, thinking, and/or behavior  PRELIMINARY DISCHARGE PLAN: Attend aftercare/continuing care group Return to previous living arrangement  PATIENT/FAMILY INVOLVEMENT: This treatment plan has been presented to and reviewed with the patient, Crystal Bradley, and/or family member, .  The patient and family have been given the opportunity to ask questions and make suggestions.  Ysidra Sopher, [place], [place] 09/12/2018, 6:35 PM

## 2018-09-12 NOTE — ED Notes (Signed)
Geodon administered IM per Dr Rhunette CroftNanavati order. Security required to manually hold patient down for medication administration. Patient refuses to de-escalate and violent restraints were ordered and applied. Patient verbally aggressive, cussing and calling MD and security inappropriate names. Patient states "You twisted my words. You think if I wanted to kill myself I couldn't do it? I would have done it! I wouldn't be here. You should come to my house. I'll show  You all the ways I can kill myself."

## 2018-09-12 NOTE — ED Notes (Signed)
Bed: WA08 Expected date:  Expected time:  Means of arrival:  Comments: Pt in room 

## 2018-09-13 DIAGNOSIS — F323 Major depressive disorder, single episode, severe with psychotic features: Principal | ICD-10-CM

## 2018-09-13 MED ORDER — ACETAMINOPHEN 325 MG PO TABS: 650.0000 mg | ORAL_TABLET | Freq: Four times a day (QID) | ORAL | Status: DC | PRN

## 2018-09-13 MED ORDER — SERTRALINE HCL 100 MG PO TABS: 50.0000 mg | ORAL_TABLET | Freq: Every day | ORAL | 1 refills | Status: DC

## 2018-09-13 MED ORDER — ALUM & MAG HYDROXIDE-SIMETH 200-200-20 MG/5ML PO SUSP: 30.0000 mL | ORAL | Status: DC | PRN

## 2018-09-13 MED ORDER — PREDNISONE 10 MG PO TABS: 10.0000 mg | ORAL_TABLET | Freq: Every day | ORAL | 1 refills | Status: DC

## 2018-09-13 MED ORDER — MAGNESIUM HYDROXIDE 400 MG/5ML PO SUSP: 30.0000 mL | Freq: Every day | ORAL | Status: DC | PRN

## 2018-09-13 MED ORDER — GABAPENTIN 100 MG PO CAPS: ORAL_CAPSULE | ORAL | 2 refills | Status: DC

## 2018-09-13 NOTE — Progress Notes (Signed)
Recreation Therapy Notes  Date: 2.11.20 Time: 1000 Location: 500 Hall Dayroom  Group Topic:  Goal Setting  Goal Area(s) Addresses:  Patient will be able to identify at least 3 life goals.  Patient will be able to identify benefit of investing in life goals.  Patient will be able to identify benefit of setting life goals.   Intervention: Worksheet, pencils  Activity: Goal Planning.  Patients were to identify goals they want to accomplish in a week, a month, in 1 yr and in 5 yrs.  Patients were to then identify obstacles to achieving their goals, what they need to accomplish their goals and what they can start doing now to reach their goals.  Education:  Discharge Planning, Coping Skills, Life Goals   Education Outcome: Acknowledges Education/In Group Clarification Provided/Needs Additional Education  Clinical Observations: Pt did not attend group.      Caroll Rancher, LRT/CTRS      Caroll Rancher A 09/13/2018 11:38 AM

## 2018-09-13 NOTE — Progress Notes (Signed)
D: Pt A & O X 4. Pt animated on approach. Denies SI, HI, AVH and pain at this time. Cooperative with care. D/C home as ordered. Bus pass given transportation. [facility] notified of pt's missing belongings. A: D/C instructions reviewed with pt including prescriptions, medication samples and follow up appointment, compliance encouraged. All belongings from locker # 16 given to pt at time of departure. Scheduled medications given with verbal education and effects monitored. Safety checks maintained without incident till time of d/c.  R: Pt receptive to care. Denies adverse drug reactions when assessed. Verbalized understanding related to d/c instructions. Signed belonging sheet in agreement with items received from locker. Ambulatory with a steady gait. Appears to be in no physical distress at time of departure.
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
## 2018-09-13 NOTE — Progress Notes (Signed)
  [facility] Adult Case Management Discharge Plan :  Will you be returning to the same living situation after discharge:  Yes,  home At discharge, do you have transportation home?: Yes,  may take an Benedetto Goad, bus pass provided as a back up Do you have the ability to pay for your medications: No. Medicaid potential.  Release of information consent forms completed and in the chart; work letter for patient and bus pass on chart.  Patient to Follow up at: Follow-up Information    Patient declines Follow up.           Next level of care provider has access to [facility] Link:no  Safety Planning and Suicide Prevention discussed: Yes,  with patient  Have you used any form of tobacco in the last 30 days? (Cigarettes, Smokeless Tobacco, Cigars, and/or Pipes): Yes  Has patient been referred to the Quitline?: Patient refused referral  Patient has been referred for addiction treatment: Yes  Darreld Mclean, LCSWA 09/13/2018, 9:37 AM
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
## 2018-09-13 NOTE — BHH Counselor (Signed)
Patient discharging within 18 hours of admission; as much, a psychosocial assessment was not completed with patient. Patient declines referrals for outpatient follow up; she states she is familiar with [facility].  Enid Cutter, LCSW-A Clinical Social Worker

## 2018-10-27 ENCOUNTER — Other Ambulatory Visit: Payer: Self-pay

## 2018-10-27 ENCOUNTER — Telehealth: Payer: Self-pay

## 2018-10-27 ENCOUNTER — Ambulatory Visit: Payer: Self-pay | Admitting: Medical

## 2018-10-27 MED ORDER — NORGESTIMATE-ETH ESTRADIOL 0.25-35 MG-MCG PO TABS: 1.0000 | ORAL_TABLET | Freq: Every day | ORAL | 11 refills | Status: DC

## 2018-10-27 MED ORDER — ONDANSETRON HCL 4 MG PO TABS: 4.0000 mg | ORAL_TABLET | Freq: Three times a day (TID) | ORAL | 0 refills | Status: DC | PRN

## 2018-10-27 NOTE — Telephone Encounter (Signed)
Spoke with pt. Pt scheduled for Webex today

## 2018-10-27 NOTE — Telephone Encounter (Signed)
Copied from CRM [id number]. Topic: General - Call Back - No Documentation >> Oct 26, 2018 10:46 AM Angela Nevin wrote: Patient returning call to [facility]- She said she was expecting call. She is requesting call back at [phone]  ext [phone]

## 2018-10-27 NOTE — Telephone Encounter (Signed)
Pt just started new job insurance has not started yet. Pt wants to know if something can be sent in for nausea and Birth control.Pt states she is on her menstrual now. Please advise.

## 2018-10-27 NOTE — Telephone Encounter (Signed)
Pt wants her [place] call in zofran and birth control. This is patient of Dr. Carmelia Roller. Would like to defer decision to pcp.

## 2018-10-27 NOTE — Addendum Note (Signed)
Addended by: Radene Gunning on: 10/27/2018 10:58 AM   Modules accepted: Orders

## 2019-06-17 ENCOUNTER — Other Ambulatory Visit: Payer: Self-pay

## 2019-06-17 ENCOUNTER — Emergency Department (HOSPITAL_BASED_OUTPATIENT_CLINIC_OR_DEPARTMENT_OTHER)
Admission: EM | Admit: 2019-06-17 | Discharge: 2019-06-17 | Disposition: A | Discharge status: Home / Self Care | Payer: Medicaid Other | Attending: Emergency Medicine | Admitting: Emergency Medicine

## 2019-06-17 ENCOUNTER — Encounter (HOSPITAL_BASED_OUTPATIENT_CLINIC_OR_DEPARTMENT_OTHER): Payer: Self-pay | Admitting: Adult Health

## 2019-06-17 ENCOUNTER — Emergency Department (HOSPITAL_BASED_OUTPATIENT_CLINIC_OR_DEPARTMENT_OTHER): Payer: Medicaid Other

## 2019-06-17 DIAGNOSIS — Y92002 Bathroom of unspecified non-institutional (private) residence single-family (private) house as the place of occurrence of the external cause: Secondary | ICD-10-CM | POA: Insufficient documentation

## 2019-06-17 DIAGNOSIS — W01198A Fall on same level from slipping, tripping and stumbling with subsequent striking against other object, initial encounter: Secondary | ICD-10-CM | POA: Insufficient documentation

## 2019-06-17 DIAGNOSIS — Z79899 Other long term (current) drug therapy: Secondary | ICD-10-CM | POA: Diagnosis not present

## 2019-06-17 DIAGNOSIS — Z91040 Latex allergy status: Secondary | ICD-10-CM | POA: Diagnosis not present

## 2019-06-17 DIAGNOSIS — G44319 Acute post-traumatic headache, not intractable: Secondary | ICD-10-CM

## 2019-06-17 DIAGNOSIS — F17210 Nicotine dependence, cigarettes, uncomplicated: Secondary | ICD-10-CM | POA: Insufficient documentation

## 2019-06-17 DIAGNOSIS — Y999 Unspecified external cause status: Secondary | ICD-10-CM | POA: Insufficient documentation

## 2019-06-17 DIAGNOSIS — S060X9A Concussion with loss of consciousness of unspecified duration, initial encounter: Secondary | ICD-10-CM | POA: Insufficient documentation

## 2019-06-17 DIAGNOSIS — Z888 Allergy status to other drugs, medicaments and biological substances status: Secondary | ICD-10-CM | POA: Insufficient documentation

## 2019-06-17 DIAGNOSIS — Y9389 Activity, other specified: Secondary | ICD-10-CM | POA: Diagnosis not present

## 2019-06-17 DIAGNOSIS — Z91013 Allergy to seafood: Secondary | ICD-10-CM | POA: Insufficient documentation

## 2019-06-17 DIAGNOSIS — S0990XA Unspecified injury of head, initial encounter: Secondary | ICD-10-CM | POA: Diagnosis present

## 2019-06-17 LAB — BASIC METABOLIC PANEL
Anion gap: 6 (ref 5–15)
BUN: 8 mg/dL (ref 6–20)
CO2: 24 mmol/L (ref 22–32)
Calcium: 9 mg/dL (ref 8.9–10.3)
Chloride: 105 mmol/L (ref 98–111)
Creatinine, Ser: 0.89 mg/dL (ref 0.44–1.00)
GFR calc Af Amer: >60 mL/min (ref >60)
GFR calc non Af Amer: >60 mL/min (ref >60)
Glucose, Bld: 109 mg/dL — ABNORMAL HIGH (ref 70–99)
Potassium: 3.4 mmol/L — ABNORMAL LOW (ref 3.5–5.1)
Sodium: 135 mmol/L (ref 135–145)

## 2019-06-17 LAB — URINALYSIS, ROUTINE W REFLEX MICROSCOPIC
Glucose, UA: NEGATIVE mg/dL
Hgb urine dipstick: NEGATIVE
Ketones, ur: NEGATIVE mg/dL
Leukocytes,Ua: NEGATIVE
Nitrite: NEGATIVE
Protein, ur: NEGATIVE mg/dL
Specific Gravity, Urine: >1.03 — ABNORMAL HIGH (ref 1.005–1.030)
pH: 5.5 (ref 5.0–8.0)

## 2019-06-17 LAB — CBC
HCT: 36.3 % (ref 36.0–46.0)
Hemoglobin: 11.5 g/dL — ABNORMAL LOW (ref 12.0–15.0)
MCH: 30.9 pg (ref 26.0–34.0)
MCHC: 31.7 g/dL (ref 30.0–36.0)
MCV: 97.6 fL (ref 80.0–100.0)
Platelets: 253 10*3/uL (ref 150–400)
RBC: 3.72 MIL/uL — ABNORMAL LOW (ref 3.87–5.11)
RDW: 12.3 % (ref 11.5–15.5)
WBC: 7.6 10*3/uL (ref 4.0–10.5)
nRBC: 0 % (ref 0.0–0.2)

## 2019-06-17 LAB — PREGNANCY, URINE: Preg Test, Ur: NEGATIVE

## 2019-06-17 MED ORDER — SODIUM CHLORIDE 0.9% FLUSH
3.0000 mL | Freq: Once | INTRAVENOUS | Status: DC
  Filled 2019-06-17: qty 3

## 2019-06-17 MED ORDER — BUTALBITAL-APAP-CAFFEINE 50-325-40 MG PO TABS: 1.0000 | ORAL_TABLET | Freq: Four times a day (QID) | ORAL | 0 refills | Status: AC | PRN

## 2019-06-17 NOTE — ED Triage Notes (Signed)
Crystal Bradley presents after a unwitnessed fall on Tuesday night. She reports that she been taking her Trazodone but she is not sleeping. She was a taking a bath on Tuesday night and something told her to getr out quickly she then reports that she woke up on the floor of her bathroom. She does not remember what happened. She is complaining of a headache that is not "headache" pain. Alert, oriented and answering all questions appropriately. She has never had anything like this happen before.

## 2019-06-17 NOTE — ED Provider Notes (Signed)
[facility] EMERGENCY DEPARTMENT Provider Note   CSN: [id number] Arrival date & time: 06/17/19  1359     History   Chief Complaint Chief Complaint  Patient presents with  . Head Injury    HPI Crystal Bradley is a 33 y.o. female.     Patient with history of depression and anxiety presents to emergency department with headache and concussion symptoms.  Patient states that Crystal Bradley was trying to relax by taking a bath early on Wednesday morning (today is Saturday) and felt a sudden urge and needed to get out of the bathtub.  Then Crystal Bradley woke up on the floor next to the bathtub.  Unknown duration of LOC.  Patient states that Crystal Bradley thinks Crystal Bradley had the back of her head because Crystal Bradley has had a posterior headache since that time.  Crystal Bradley some blurry vision but no loss of vision.  No neck pain.  Crystal Bradley has never had a seizure and denies any tongue biting.  Crystal Bradley did have generalized body aches worse in her lower back and right lower leg after the fall.  Patient used to take benzodiazepines but does not anymore.  Crystal Bradley denies any daily alcohol use.  Crystal Bradley does not take tramadol but has recently been started on trazodone for sleep.  Crystal Bradley has had trouble with her concentration and focusing.  No difficulty walking.  No nausea or vomiting.  Crystal Bradley reports light sensitivity with her headache.     Past Medical History:  Diagnosis Date  . Anemia   . Anxiety   . Hiatal hernia   . Migraine     Patient Active Problem List   Diagnosis Date Noted  . Severe major depression with psychotic features, mood-congruent (HCC) 09/13/2018  . Vitamin D deficiency 11/03/2017  . GAD (generalized anxiety disorder) 08/27/2017    Past Surgical History:  Procedure Laterality Date  . EYE SURGERY       OB History   No obstetric history on file.      Home Medications    Prior to Admission medications   Medication Sig Start Date End Date Taking? Authorizing Provider  gabapentin (NEURONTIN) 100 MG capsule 1 bid 3 at hs  09/13/18   Malvin JohnsFarah, Brian, MD  norgestimate-ethinyl estradiol (SPRINTEC 28) 0.25-35 MG-MCG tablet Take 1 tablet by mouth daily. 10/27/18   Sharlene DoryWendling, Nicholas Paul, DO  ondansetron (ZOFRAN) 4 MG tablet Take 1 tablet (4 mg total) by mouth every 8 (eight) hours as needed. 10/27/18   Sharlene DoryWendling, Nicholas Paul, DO  predniSONE (DELTASONE) 10 MG tablet Take 1 tablet (10 mg total) by mouth daily with breakfast. 09/13/18   Malvin JohnsFarah, Brian, MD  sertraline (ZOLOFT) 100 MG tablet Take 0.5 tablets (50 mg total) by mouth daily. 09/13/18   Malvin JohnsFarah, Brian, MD    Family History Family History  Problem Relation Age of Onset  . Cancer Mother        MM  . Breast cancer Maternal Aunt   . Autoimmune disease Neg Hx     Social History Social History   Tobacco Use  . Smoking status: Current Every Day Smoker    Packs/day: 0.50    Types: Cigarettes  . Smokeless tobacco: Never Used  Substance Use Topics  . Alcohol use: Yes    Comment: social  . Drug use: No     Allergies   Latex, Shellfish-derived products, Ativan [lorazepam], Hydroxyzine, and Latex   Review of Systems Review of Systems  Constitutional: Negative for fever.  HENT: Negative for congestion, dental problem,  rhinorrhea and sinus pressure.   Eyes: Positive for photophobia and visual disturbance. Negative for discharge and redness.  Respiratory: Negative for shortness of breath.   Cardiovascular: Negative for chest pain.  Gastrointestinal: Negative for nausea and vomiting.  Musculoskeletal: Positive for back pain and myalgias. Negative for gait problem, neck pain and neck stiffness.  Skin: Negative for rash.  Neurological: Positive for syncope and headaches. Negative for seizures, speech difficulty, weakness, light-headedness and numbness.  Psychiatric/Behavioral: Positive for sleep disturbance. Negative for confusion.     Physical Exam Updated Vital Signs BP 111/79   Pulse 95   Temp 99.3 F (37.4 C) (Oral)   Resp 18   Ht 5\' 9"  (1.753 m)    Wt 86.2 kg   LMP 06/04/2019 (Exact Date)   SpO2 99%   BMI 28.06 kg/m   Physical Exam Vitals signs and nursing note reviewed.  Constitutional:      Appearance: Crystal Bradley is well-developed.  HENT:     Head: Normocephalic and atraumatic.     Comments: No signs of trauma to the posterior head or the remainder of the scalp.    Right Ear: Tympanic membrane, ear canal and external ear normal.     Left Ear: Tympanic membrane, ear canal and external ear normal.     Nose: Nose normal.     Mouth/Throat:     Pharynx: Uvula midline.     Comments: No lateral tongue biting noted. Eyes:     General: Lids are normal.     Extraocular Movements:     Right eye: No nystagmus.     Left eye: No nystagmus.     Conjunctiva/sclera: Conjunctivae normal.     Pupils: Pupils are equal, round, and reactive to light.  Neck:     Musculoskeletal: Normal range of motion and neck supple.  Cardiovascular:     Rate and Rhythm: Normal rate and regular rhythm.  Pulmonary:     Effort: Pulmonary effort is normal.     Breath sounds: Normal breath sounds.  Abdominal:     Palpations: Abdomen is soft.     Tenderness: There is no abdominal tenderness.  Musculoskeletal:     Cervical back: Crystal Bradley exhibits normal range of motion, no tenderness and no bony tenderness.  Skin:    General: Skin is warm and dry.  Neurological:     Mental Status: Crystal Bradley is alert and oriented to person, place, and time.     GCS: GCS eye subscore is 4. GCS verbal subscore is 5. GCS motor subscore is 6.     Cranial Nerves: No cranial nerve deficit.     Sensory: No sensory deficit.     Coordination: Coordination normal.     Gait: Gait normal.     Deep Tendon Reflexes: Reflexes are normal and symmetric.  Psychiatric:     Comments: Patient is very anxious.      ED Treatments / Results  Labs (all labs ordered are listed, but only abnormal results are displayed) Labs Reviewed  BASIC METABOLIC PANEL - Abnormal; Notable for the following components:       Result Value   Potassium 3.4 (*)    Glucose, Bld 109 (*)    All other components within normal limits  CBC - Abnormal; Notable for the following components:   RBC 3.72 (*)    Hemoglobin 11.5 (*)    All other components within normal limits  URINALYSIS, ROUTINE W REFLEX MICROSCOPIC - Abnormal; Notable for the following components:   APPearance HAZY (*)  Specific Gravity, Urine >1.030 (*)    Bilirubin Urine SMALL (*)    All other components within normal limits  PREGNANCY, URINE    ED ECG REPORT   Date: 06/17/2019  Rate: 80  Rhythm: normal sinus rhythm  QRS Axis: normal  Intervals: normal  ST/T Wave abnormalities: normal  Conduction Disutrbances:none  Narrative Interpretation:   Old EKG Reviewed: changes noted, slower today compared to 2/10  I have personally reviewed the EKG tracing and agree with the computerized printout as noted.   Radiology No results found.  Procedures Procedures (including critical care time)  Medications Ordered in ED Medications  sodium chloride flush (NS) 0.9 % injection 3 mL (has no administration in time range)     Initial Impression / Assessment and Plan / ED Course  I have reviewed the triage vital signs and the nursing notes.  Pertinent labs & imaging results that were available during my care of the patient were reviewed by me and considered in my medical decision making (see chart for details).        Patient seen and examined.  Lab work ordered at triage reviewed.  No significant findings.  UA with concentrated urine.  Will order head CT given her neurologic symptoms, headache, post trauma.  Unclear why patient fell out.  EKG personally reviewed.   Vital signs reviewed and are as follows: BP 111/79   Pulse 95   Temp 99.3 F (37.4 C) (Oral)   Resp 18   Ht 5\' 9"  (1.753 m)   Wt 86.2 kg   LMP 06/04/2019 (Exact Date)   SpO2 99%   BMI 28.06 kg/m   4:50 PM Head CT reviewed. There is an area of ?  Encephalomalacia of  unclear chronicity. Discussed with Dr. 13/08/2018.   Strongly encourage PCP follow-up to have area on CT evaluated further.  We discussed concussion precautions and need for cognitive rest and avoidance of triggers that make her symptoms worse.  Crystal Bradley should follow-up with her doctor for recheck of her symptoms.  Discussed need to return with worsening symptoms, severe headache, vomiting, seizure like activity, new symptoms or other concerns.  Final Clinical Impressions(s) / ED Diagnoses   Final diagnoses:  Concussion with loss of consciousness, initial encounter  Acute post-traumatic headache, not intractable   Head injury: CT imaging negative for bleeding.  Patient with symptoms consistent with concussion.  Neurologically intact.  Loss of consciousness: EKG is okay, labs are reassuring.  Patient does not have any definitive symptoms or risk factors for seizures, however this is on differential.  Significance of area of questionable encephalomalacia unknown at this time and this will need to be further evaluated by her doctor.  Patient is low risk for any cardiogenic cause of syncope given risk factor profile.  No chest pain or shortness of breath to suggest PE.  No signs of Brugada syndrome, prolonged QT, arrhythmia or other abnormalities on EKG today.  Comfortable with discharge to home at this time with PCP follow-up.   ED Discharge Orders         Ordered    butalbital-acetaminophen-caffeine (FIORICET) 50-325-40 MG tablet  Every 6 hours PRN     06/17/19 1648           06/19/19, PA-C 06/17/19 1653    06/19/19, MD 06/17/19 2324

## 2019-06-17 NOTE — Discharge Instructions (Signed)
Please read and follow all provided instructions.  Your diagnoses today include:  1. Concussion with loss of consciousness, initial encounter   2. Acute post-traumatic headache, not intractable     Tests performed today include:  CT scan of your head that did not show any serious injury, you do have an abnormal area on the brain that looks old and this will need to be followed up by your regular doctor.   Vital signs. See below for your results today.   Medications prescribed:   Butalbital -prescription medicine for headache if needed  Take any prescribed medications only as directed.  Home care instructions:  Follow any educational materials contained in this packet.  BE VERY CAREFUL not to take multiple medicines containing Tylenol (also called acetaminophen). Doing so can lead to an overdose which can damage your liver and cause liver failure and possibly death.   Follow-up instructions: Please follow-up with your primary care provider in the next 7 days for further evaluation of your symptoms.   Return instructions:  SEEK IMMEDIATE MEDICAL ATTENTION IF:  There is confusion or drowsiness (although children frequently become drowsy after injury).   You cannot awaken the injured person.   You have more than one episode of vomiting.   You notice dizziness or unsteadiness which is getting worse, or inability to walk.   You have convulsions or unconsciousness.   You experience severe, persistent headaches not relieved by Tylenol.  You cannot use arms or legs normally.   There are changes in pupil sizes. (This is the black center in the colored part of the eye)   There is clear or bloody discharge from the nose or ears.   You have change in speech, vision, swallowing, or understanding.   Localized weakness, numbness, tingling, or change in bowel or bladder control.  You have any other emergent concerns.  Additional Information: You have had a head injury which does  not appear to require admission at this time.  Your vital signs today were: BP 111/79    Pulse 95    Temp 99.3 F (37.4 C) (Oral)    Resp 18    Ht 5\' 9"  (1.753 m)    Wt 86.2 kg    LMP 06/04/2019 (Exact Date)    SpO2 99%    BMI 28.06 kg/m  If your blood pressure (BP) was elevated above 135/85 this visit, please have this repeated by your doctor within one month. --------------

## 2019-06-22 ENCOUNTER — Encounter: Payer: Self-pay | Admitting: Family Medicine

## 2019-06-26 ENCOUNTER — Telehealth: Payer: Medicaid Other | Admitting: Physician Assistant

## 2019-06-26 ENCOUNTER — Ambulatory Visit: Payer: Medicaid Other | Admitting: Medical

## 2019-06-26 ENCOUNTER — Encounter: Payer: Self-pay | Admitting: Family Medicine

## 2019-06-26 ENCOUNTER — Other Ambulatory Visit: Payer: Self-pay

## 2019-06-26 ENCOUNTER — Ambulatory Visit (INDEPENDENT_AMBULATORY_CARE_PROVIDER_SITE_OTHER): Payer: Medicaid Other | Admitting: Family Medicine

## 2019-06-26 DIAGNOSIS — R42 Dizziness and giddiness: Secondary | ICD-10-CM

## 2019-06-26 DIAGNOSIS — R9089 Other abnormal findings on diagnostic imaging of central nervous system: Secondary | ICD-10-CM | POA: Diagnosis not present

## 2019-06-26 MED ORDER — MECLIZINE HCL 25 MG PO TABS: 25.0000 mg | ORAL_TABLET | Freq: Three times a day (TID) | ORAL | 0 refills | Status: DC | PRN

## 2019-06-26 NOTE — Progress Notes (Signed)
CC: ER f/u  Subjective: Patient is a 33 y.o. female here for Er f/u. Due to COVID-19 pandemic, we are interacting via web portal for an electronic face-to-face visit. I verified patient's ID using 2 identifiers. Patient agreed to proceed with visit via this method. Patient is at home, I am at office. Patient and I are present for visit.   Pt passed out on 11/14, went to ED. CT showed a lesion in L frontal lobe.  She reports a history of migraines and odd sensations in the center of her head.  She has no history of seizures or significant trauma to her head.  She has no known history of stroke or family history of early strokes.   ROS:  Neuro: As noted in HPI  Past Medical History:  Diagnosis Date  . Anemia   . Anxiety   . Hiatal hernia   . Migraine     Objective: LMP 06/04/2019 (Exact Date)  No conversational dyspnea Age appropriate judgment and insight Nml affect and mood  Assessment and Plan: Abnormal CT of brain - Plan: MR BRAIN W CONTRAST  Check MRI.  Based on her insurance, she should contact them as there may be a physician/provider who needs to order this test to get it approved. Follow-up as needed. The patient voiced understanding and agreement to the plan.  [place], DO 06/26/19  5:04 PM

## 2019-06-26 NOTE — Progress Notes (Signed)
I am sorry that you are not feeling well today . Your symptoms of headache, dizziness and loss of appetite could be related to early signs of a viral illness. This cold be the flu or covid 19. This also could be related to a more serious problem if you have associated symptoms such as chest pain, shortness of breath, numbness, slurred speech, facial asymmetry or vision changes. If you have those symptoms you should be evaluated in person by a provider right away.   Corona Virus Screening  .  Many health care providers can now test patients at their office for covid but not all are.  [place] has multiple testing sites. For information on our COVID testing locations and hours go to [email or web address]  Please quarantine yourself while awaiting your test results.  We are enrolling you in our [place] for [place] . Daily you will receive a questionnaire within the [facility] website. Our COVID 19 response team willl be monitoriing your responses daily. Please continue good preventive care measures, including:  frequent hand-washing, avoid touching your face, cover coughs/sneezes, stay out of crowds and keep a 6 foot distance from others.    COVID-19 is a respiratory illness with symptoms that are similar to the flu. Symptoms are typically mild to moderate, but there have been cases of severe illness and death due to the virus. The following symptoms may appear 2-14 days after exposure: . Fever . Cough . Shortness of breath or difficulty breathing . Chills . Repeated shaking with chills . Muscle pain . Headache . Sore throat . New loss of taste or smell . Fatigue . Congestion or runny nose . Nausea or vomiting . Diarrhea  If you develop fever/cough/breathlessness, please stay home for 10 days with improving symptoms and until you have had 24 hours of no fever (without taking a fever reducer).  Go to the nearest hospital ED for assessment if  fever/cough/breathlessness are severe or illness seems like a threat to life.  It is vitally important that if you feel that you have an infection such as this virus or any other virus that you stay home and away from places where you may spread it to others.  You should avoid contact with people age 36 and older.   You should wear a mask or cloth face covering over your nose and mouth if you must be around other people or animals, including pets (even at home). Try to stay at least 6 feet away from other people. This will protect the people around you.  You can use medication such as Meclizine for dizziness which I have sent to the pharmacy for you  You may take motrin for headache  You may also take acetaminophen (Tylenol) as needed for fever.   Reduce your risk of any infection by using the same precautions used for avoiding the common cold or flu:  Marland Kitchen Wash your hands often with soap and warm water for at least 20 seconds.  If soap and water are not readily available, use an alcohol-based hand sanitizer with at least 60% alcohol.  . If coughing or sneezing, cover your mouth and nose by coughing or sneezing into the elbow areas of your shirt or coat, into a tissue or into your sleeve (not your hands). . Avoid shaking hands with others and consider head nods or verbal greetings only. . Avoid touching your eyes, nose, or mouth with unwashed hands.  . Avoid close contact with people who are sick. Marland Kitchen  Avoid places or events with large numbers of people in one location, like concerts or sporting events. . Carefully consider travel plans you have or are making. . If you are planning any travel outside or inside the [place], visit the CDC's Travelers' Health webpage for the latest health notices. . If you have some symptoms but not all symptoms, continue to monitor at home and seek medical attention if your symptoms worsen. . If you are having a medical emergency, call 911.  HOME CARE . Only take  medications as instructed by your medical team. . Drink plenty of fluids and get plenty of rest. . A steam or ultrasonic humidifier can help if you have congestion.   GET HELP RIGHT AWAY IF YOU HAVE EMERGENCY WARNING SIGNS** FOR COVID-19. If you or someone is showing any of these signs seek emergency medical care immediately. Call 911 or proceed to your closest emergency facility if: . You develop worsening high fever. . Trouble breathing . Bluish lips or face . Persistent pain or pressure in the chest . New confusion . Inability to wake or stay awake . You cough up blood. . Your symptoms become more severe  **This list is not all possible symptoms. Contact your medical provider for any symptoms that are sever or concerning to you.   MAKE SURE YOU   Understand these instructions.  Will watch your condition.  Will get help right away if you are not doing well or get worse.  Your e-visit answers were reviewed by a board certified advanced clinical practitioner to complete your personal care plan.  Depending on the condition, your plan could have included both over the counter or prescription medications.  If there is a problem please reply once you have received a response from your provider.  Your safety is important to [place].  If you have drug allergies check your prescription carefully.    You can use MyChart to ask questions about today's visit, request a non-urgent call back, or ask for a work or school excuse for 24 hours related to this e-Visit. If it has been greater than 24 hours you will need to follow up with your provider, or enter a new e-Visit to address those concerns. You will get an e-mail in the next two days asking about your experience.  I hope that your e-visit has been valuable and will speed your recovery. Thank you for using e-visits.   I spent 10 minutes on this chart

## 2019-07-08 ENCOUNTER — Other Ambulatory Visit: Payer: Self-pay

## 2019-07-08 ENCOUNTER — Ambulatory Visit (HOSPITAL_BASED_OUTPATIENT_CLINIC_OR_DEPARTMENT_OTHER)
Admission: RE | Admit: 2019-07-08 | Discharge: 2019-07-08 | Disposition: A | Discharge status: Home / Self Care | Payer: Medicaid Other | Source: Ambulatory Visit | Attending: Family Medicine | Admitting: Family Medicine

## 2019-07-08 DIAGNOSIS — R9089 Other abnormal findings on diagnostic imaging of central nervous system: Secondary | ICD-10-CM | POA: Diagnosis present

## 2019-07-08 MED ORDER — GADOBUTROL 1 MMOL/ML IV SOLN
8.6000 mL | Freq: Once | INTRAVENOUS | Status: AC | PRN
  Administered 2019-07-08: 11:00:00 8.6 mL via INTRAVENOUS

## 2019-07-11 ENCOUNTER — Encounter: Payer: Self-pay | Admitting: Family Medicine

## 2019-08-07 ENCOUNTER — Telehealth: Payer: Medicaid Other | Admitting: Physician Assistant

## 2019-08-07 DIAGNOSIS — L0291 Cutaneous abscess, unspecified: Secondary | ICD-10-CM

## 2019-08-07 NOTE — Progress Notes (Signed)
Hi Crystal Bradley,  I am sorry you are not feeling well.  It sounds like you could possibly have an abscess.  Abscesses are best treated by drainage and antibiotics.  I would feel more comfortable if you were seen face-to-face by a medical provider for evaluation to see if you need help draining the area.   Based on what you shared with me, I feel your condition warrants further evaluation and I recommend that you be seen for a face to face office visit.   NOTE: If you entered your credit card information for this eVisit, you will not be charged. You may see a "hold" on your card for the $35 but that hold will drop off and you will not have a charge processed.   If you are having a true medical emergency please call 911.      For an urgent face to face visit, [place] has five urgent care centers for your convenience:      NEW:  [facility] at [facility] Directions [phone] [address] [place], [place] [address] . 10 am - 6pm Monday - Friday    [facility]) Get Driving Directions [phone] [address] [place], [place] [address] . 10 am to 8 pm Monday-Friday . 12 pm to 8 pm [facility] at [facility] Get Driving Directions [phone] [address] [place], [place] [address] . 8 am to 8 pm Monday-Friday . 9 am to 6 pm Saturday . 11 am to 6 pm Sunday     [facility] at [facility] Get Driving Directions  [phone] [address] [place], [place] [address] . 8 am to 8 pm Monday-Friday . 8 am to 4 pm [facility] Urgent Care at [facility] Directions [phone] [address] [place], [place] [address] . 12 pm to 6 pm Monday-Friday      Your e-visit answers were reviewed by a board certified advanced clinical practitioner to complete your personal care plan.  Thank  you for using e-Visits.

## 2019-08-08 ENCOUNTER — Encounter (HOSPITAL_BASED_OUTPATIENT_CLINIC_OR_DEPARTMENT_OTHER): Payer: Self-pay | Admitting: *Deleted

## 2019-08-08 ENCOUNTER — Other Ambulatory Visit: Payer: Self-pay

## 2019-08-08 ENCOUNTER — Emergency Department (HOSPITAL_BASED_OUTPATIENT_CLINIC_OR_DEPARTMENT_OTHER)
Admission: EM | Admit: 2019-08-08 | Discharge: 2019-08-08 | Disposition: A | Discharge status: Home / Self Care | Payer: Medicaid Other | Attending: Emergency Medicine | Admitting: Emergency Medicine

## 2019-08-08 DIAGNOSIS — Z202 Contact with and (suspected) exposure to infections with a predominantly sexual mode of transmission: Secondary | ICD-10-CM | POA: Diagnosis not present

## 2019-08-08 DIAGNOSIS — Z91040 Latex allergy status: Secondary | ICD-10-CM | POA: Diagnosis not present

## 2019-08-08 DIAGNOSIS — N390 Urinary tract infection, site not specified: Secondary | ICD-10-CM

## 2019-08-08 DIAGNOSIS — F17210 Nicotine dependence, cigarettes, uncomplicated: Secondary | ICD-10-CM | POA: Insufficient documentation

## 2019-08-08 DIAGNOSIS — N760 Acute vaginitis: Secondary | ICD-10-CM | POA: Insufficient documentation

## 2019-08-08 DIAGNOSIS — Z113 Encounter for screening for infections with a predominantly sexual mode of transmission: Secondary | ICD-10-CM

## 2019-08-08 DIAGNOSIS — Z79899 Other long term (current) drug therapy: Secondary | ICD-10-CM | POA: Diagnosis not present

## 2019-08-08 DIAGNOSIS — B9689 Other specified bacterial agents as the cause of diseases classified elsewhere: Secondary | ICD-10-CM

## 2019-08-08 DIAGNOSIS — Z7251 High risk heterosexual behavior: Secondary | ICD-10-CM

## 2019-08-08 DIAGNOSIS — R300 Dysuria: Secondary | ICD-10-CM | POA: Diagnosis present

## 2019-08-08 LAB — URINALYSIS, ROUTINE W REFLEX MICROSCOPIC
Glucose, UA: NEGATIVE mg/dL
Ketones, ur: NEGATIVE mg/dL
Nitrite: NEGATIVE
Protein, ur: 100 mg/dL — AB
Specific Gravity, Urine: >1.03 — ABNORMAL HIGH (ref 1.005–1.030)
pH: 5.5 (ref 5.0–8.0)

## 2019-08-08 LAB — RPR: RPR Ser Ql: NONREACTIVE

## 2019-08-08 LAB — URINALYSIS, MICROSCOPIC (REFLEX)
RBC / HPF: >50 RBC/hpf (ref 0–5)
WBC, UA: >50 WBC/hpf (ref 0–5)

## 2019-08-08 LAB — WET PREP, GENITAL
Sperm: NONE SEEN
Trich, Wet Prep: NONE SEEN
WBC, Wet Prep HPF POC: NONE SEEN
Yeast Wet Prep HPF POC: NONE SEEN

## 2019-08-08 LAB — HIV ANTIBODY (ROUTINE TESTING W REFLEX): HIV Screen 4th Generation wRfx: NONREACTIVE

## 2019-08-08 LAB — PREGNANCY, URINE: Preg Test, Ur: NEGATIVE

## 2019-08-08 MED ORDER — CEPHALEXIN 500 MG PO CAPS: 500.0000 mg | ORAL_CAPSULE | Freq: Two times a day (BID) | ORAL | 0 refills | Status: DC

## 2019-08-08 MED ORDER — METRONIDAZOLE 500 MG PO TABS
500.0000 mg | ORAL_TABLET | Freq: Once | ORAL | Status: AC
  Administered 2019-08-08: 500 mg via ORAL
  Filled 2019-08-08: qty 1

## 2019-08-08 MED ORDER — METRONIDAZOLE 500 MG PO TABS: 500.0000 mg | ORAL_TABLET | Freq: Two times a day (BID) | ORAL | 0 refills | Status: DC

## 2019-08-08 MED ORDER — PHENAZOPYRIDINE HCL 95 MG PO TABS: 95.0000 mg | ORAL_TABLET | Freq: Three times a day (TID) | ORAL | 0 refills | Status: DC | PRN

## 2019-08-08 MED ORDER — PROBIOTIC PO CAPS: 1.0000 | ORAL_CAPSULE | Freq: Every day | ORAL | 0 refills | Status: DC

## 2019-08-08 MED ORDER — CEFTRIAXONE SODIUM 1 G IJ SOLR
1.0000 g | Freq: Once | INTRAMUSCULAR | Status: AC
  Administered 2019-08-08: 1 g via INTRAMUSCULAR
  Filled 2019-08-08: qty 10

## 2019-08-08 MED ORDER — AZITHROMYCIN 250 MG PO TABS
1000.0000 mg | ORAL_TABLET | Freq: Once | ORAL | Status: AC
  Administered 2019-08-08: 06:00:00 1000 mg via ORAL
  Filled 2019-08-08: qty 4

## 2019-08-08 MED ORDER — ONDANSETRON 4 MG PO TBDP: 4.0000 mg | ORAL_TABLET | Freq: Four times a day (QID) | ORAL | 0 refills | Status: DC | PRN

## 2019-08-08 NOTE — Discharge Instructions (Addendum)
You have been tested for gonorrhea, chlamydia today.  You have been given empiric treatment.  Your test for trichomonas were negative.  You do have bacterial vaginosis and a urinary tract infection which are not sexually transmitted diseases.  You will be on 2 antibiotics that you take twice a day for 1 week.  Please do not drink alcohol while on these medications.  Your HIV, syphilis, gonorrhea and Chlamydia tests are pending.  If you are positive, you will be contacted.  You may follow-up on your results through MyChart.   You may alternate Tylenol 1000 mg every 6 hours as needed for pain, fever and Ibuprofen 800 mg every 8 hours as needed for pain, fever.  Please take Ibuprofen with food.  Do not take more than 4000 mg of Tylenol (acetaminophen) in a 24 hour period.

## 2019-08-08 NOTE — ED Notes (Addendum)
After triage , pt states she took plan B dec 28th , new sex partners , also reports vaginal discharge and wants STD check

## 2019-08-08 NOTE — ED Notes (Signed)
Called to take to room  No response from lobby  

## 2019-08-08 NOTE — ED Provider Notes (Signed)
TIME SEEN: 5:24 AM  CHIEF COMPLAINT: Dysuria  HPI: Patient is a 34 year old female who presents to the emergency department 2 days of burning with urination.  She has had previous urinary tract infections but states that she thinks this feels different.  She denies any vaginal discharge but is having vaginal bleeding.  She states she recently had on protected sex and is concerned about STDs.  She recently took Plan B.  No abdominal pain.  No flank pain.  No fever.  No nausea or vomiting.  ROS: See HPI Constitutional: no fever  Eyes: no drainage  ENT: no runny nose   Cardiovascular:  no chest pain  Resp: no SOB  GI: no vomiting GU:  dysuria Integumentary: no rash  Allergy: no hives  Musculoskeletal: no leg swelling  Neurological: no slurred speech ROS otherwise negative  PAST MEDICAL HISTORY/PAST SURGICAL HISTORY:  Past Medical History:  Diagnosis Date  . Anemia   . Anxiety   . Hiatal hernia   . Migraine     MEDICATIONS:  Prior to Admission medications   Medication Sig Start Date End Date Taking? Authorizing Provider  butalbital-acetaminophen-caffeine (FIORICET) 50-325-40 MG tablet Take 1 tablet by mouth every 6 (six) hours as needed for headache. 06/17/19 06/16/20  Carlisle Cater, PA-C  gabapentin (NEURONTIN) 100 MG capsule 1 bid 3 at hs 09/13/18   Johnn Hai, MD  meclizine (ANTIVERT) 25 MG tablet Take 1 tablet (25 mg total) by mouth 3 (three) times daily as needed for dizziness. 06/26/19   Alveria Apley, PA-C  norgestimate-ethinyl estradiol (SPRINTEC 28) 0.25-35 MG-MCG tablet Take 1 tablet by mouth daily. 10/27/18   Shelda Pal, DO  ondansetron (ZOFRAN) 4 MG tablet Take 1 tablet (4 mg total) by mouth every 8 (eight) hours as needed. 10/27/18   Shelda Pal, DO  predniSONE (DELTASONE) 10 MG tablet Take 1 tablet (10 mg total) by mouth daily with breakfast. 09/13/18   Johnn Hai, MD  sertraline (ZOLOFT) 100 MG tablet Take 0.5 tablets (50 mg total) by mouth  daily. 09/13/18   Johnn Hai, MD    ALLERGIES:  Allergies  Allergen Reactions  . Latex Swelling  . Shellfish-Derived Products Swelling  . Ativan [Lorazepam]     Adverse reactions/ agitation  . Hydroxyzine     Adverse reactions/agitation  . Latex     SOCIAL HISTORY:  Social History   Tobacco Use  . Smoking status: Current Every Day Smoker    Packs/day: 0.50    Types: Cigarettes  . Smokeless tobacco: Never Used  Substance Use Topics  . Alcohol use: Yes    Comment: social    FAMILY HISTORY: Family History  Problem Relation Age of Onset  . Cancer Mother        MM  . Breast cancer Maternal Aunt   . Autoimmune disease Neg Hx     EXAM: BP 119/89   Pulse 89   Temp 98.1 F (36.7 C) (Oral)   Resp 18   Ht 5\' 9"  (1.753 m)   Wt 86.2 kg   LMP 07/23/2019   SpO2 100%   BMI 28.06 kg/m  CONSTITUTIONAL: Alert and oriented and responds appropriately to questions. Well-appearing; well-nourished HEAD: Normocephalic EYES: Conjunctivae clear, pupils appear equal, EOM appear intact ENT: normal nose; moist mucous membranes NECK: Supple, normal ROM CARD: RRR; S1 and S2 appreciated; no murmurs, no clicks, no rubs, no gallops RESP: Normal chest excursion without splinting or tachypnea; breath sounds clear and equal bilaterally; no wheezes, no  rhonchi, no rales, no hypoxia or respiratory distress, speaking full sentences ABD/GI: Normal bowel sounds; non-distended; soft, non-tender, no rebound, no guarding, no peritoneal signs, no hepatosplenomegaly BACK:  The back appears normal, no CVA tenderness EXT: Normal ROM in all joints; no deformity noted, no edema; no cyanosis SKIN: Normal color for age and race; warm; no rash on exposed skin NEURO: Moves all extremities equally PSYCH: The patient's mood and manner are appropriate.   MEDICAL DECISION MAKING: Patient here with dysuria.  Urine appears infected today.  Will send urine culture and treat with Keflex.  She is not sure what  antibiotics have previously helped her UTIs.  She is also very concerned about STDs.  She has no vaginal discharge no abdominal pelvic pain.  She will self swab to check for STDs today.  I have recommended empiric treatment for gonorrhea and chlamydia given her unprotected sex and she agrees.  We will also obtain HIV and syphilis test.  Doubt torsion, TOA, PID based on her benign exam.  Recommended alternating Tylenol and Motrin for pain.  We will also discharge with prescription for Azo.  ED PROGRESS: Wet prep positive for clue cells but no Trichomonas or yeast seen.  We will also discharge with Flagyl.  Discussed importance of refraining from sex for the next week.  She will be contacted if STD results are positive.  Given instructions on how to follow-up and results on MyChart.   At this time, I do not feel there is any life-threatening condition present. I have reviewed, interpreted and discussed all results (EKG, imaging, lab, urine as appropriate) and exam findings with patient/family. I have reviewed nursing notes and appropriate previous records.  I feel the patient is safe to be discharged home without further emergent workup and can continue workup as an outpatient as needed. Discussed usual and customary return precautions. Patient/family verbalize understanding and are comfortable with this plan.  Outpatient follow-up has been provided as needed. All questions have been answered.      Crystal Bradley was evaluated in Emergency Department on 08/08/2019 for the symptoms described in the history of present illness. She was evaluated in the context of the global COVID-19 pandemic, which necessitated consideration that the patient might be at risk for infection with the SARS-CoV-2 virus that causes COVID-19. Institutional protocols and algorithms that pertain to the evaluation of patients at risk for COVID-19 are in a state of rapid change based on information released by regulatory bodies including  the CDC and federal and state organizations. These policies and algorithms were followed during the patient's care in the ED.  Patient was seen wearing N95, face shield, gloves.    Alvy Alsop, Layla Maw, DO 08/08/19 [phone]

## 2019-08-08 NOTE — ED Triage Notes (Signed)
Pt c/o dysuria x 2 days

## 2019-08-08 NOTE — ED Notes (Signed)
Pt verbalized understanding of dc instructions.

## 2019-08-09 LAB — GC/CHLAMYDIA PROBE AMP (~~LOC~~) NOT AT ARMC
Chlamydia: NEGATIVE
Neisseria Gonorrhea: NEGATIVE

## 2019-08-09 LAB — URINE CULTURE: Culture: NO GROWTH

## 2020-09-09 ENCOUNTER — Ambulatory Visit: Payer: Medicaid Other | Admitting: Family Medicine

## 2020-11-04 ENCOUNTER — Other Ambulatory Visit: Payer: Self-pay

## 2020-11-04 ENCOUNTER — Ambulatory Visit (INDEPENDENT_AMBULATORY_CARE_PROVIDER_SITE_OTHER): Payer: Medicaid Other | Admitting: Family Medicine

## 2020-11-04 ENCOUNTER — Encounter: Payer: Self-pay | Admitting: Family Medicine

## 2020-11-04 VITALS — BP 120/80 | HR 100 | Temp 98.4°F | Ht 69.0 in | Wt 220.1 lb

## 2020-11-04 DIAGNOSIS — F321 Major depressive disorder, single episode, moderate: Secondary | ICD-10-CM | POA: Diagnosis not present

## 2020-11-04 DIAGNOSIS — R0683 Snoring: Secondary | ICD-10-CM | POA: Diagnosis not present

## 2020-11-04 DIAGNOSIS — R4189 Other symptoms and signs involving cognitive functions and awareness: Secondary | ICD-10-CM | POA: Diagnosis not present

## 2020-11-04 LAB — T4, FREE: Free T4: 0.82 ng/dL (ref 0.60–1.60)

## 2020-11-04 LAB — COMPREHENSIVE METABOLIC PANEL
ALT: 21 U/L (ref 0–35)
AST: 18 U/L (ref 0–37)
Albumin: 4.2 g/dL (ref 3.5–5.2)
Alkaline Phosphatase: 49 U/L (ref 39–117)
BUN: 6 mg/dL (ref 6–23)
CO2: 24 mEq/L (ref 19–32)
Calcium: 9.2 mg/dL (ref 8.4–10.5)
Chloride: 107 mEq/L (ref 96–112)
Creatinine, Ser: 0.72 mg/dL (ref 0.40–1.20)
GFR: 107.69 mL/min (ref >60.00)
Glucose, Bld: 107 mg/dL — ABNORMAL HIGH (ref 70–99)
Potassium: 3.4 mEq/L — ABNORMAL LOW (ref 3.5–5.1)
Sodium: 139 mEq/L (ref 135–145)
Total Bilirubin: 0.4 mg/dL (ref 0.2–1.2)
Total Protein: 7.5 g/dL (ref 6.0–8.3)

## 2020-11-04 LAB — CBC
HCT: 37.6 % (ref 36.0–46.0)
Hemoglobin: 12.3 g/dL (ref 12.0–15.0)
MCHC: 32.6 g/dL (ref 30.0–36.0)
MCV: 94.6 fl (ref 78.0–100.0)
Platelets: 291 10*3/uL (ref 150.0–400.0)
RBC: 3.98 Mil/uL (ref 3.87–5.11)
RDW: 12.9 % (ref 11.5–15.5)
WBC: 7.2 10*3/uL (ref 4.0–10.5)

## 2020-11-04 LAB — VITAMIN B12: Vitamin B-12: 538 pg/mL (ref 211–911)

## 2020-11-04 LAB — TSH: TSH: 1.45 u[IU]/mL (ref 0.35–4.50)

## 2020-11-04 MED ORDER — ETONOGESTREL-ETHINYL ESTRADIOL 0.12-0.015 MG/24HR VA RING: VAGINAL_RING | VAGINAL | 12 refills | Status: DC

## 2020-11-04 MED ORDER — FLUTICASONE PROPIONATE 50 MCG/ACT NA SUSP: 2.0000 | Freq: Every day | NASAL | 2 refills | Status: DC

## 2020-11-04 MED ORDER — DULOXETINE HCL 30 MG PO CPEP: 30.0000 mg | ORAL_CAPSULE | Freq: Every day | ORAL | 3 refills | Status: DC

## 2020-11-04 NOTE — Patient Instructions (Signed)
Give [place] 2-3 business days to get the results of your labs back.   Keep the diet clean and stay active.  If you do not hear anything about your referral in the next 1-2 weeks, call our office and ask for an update.  Continue with the counseling team.   Let us know if you need anything.

## 2020-11-04 NOTE — Progress Notes (Signed)
Chief Complaint  Patient presents with  . Follow-up    Followup from last appt/06/2019    Subjective: Patient is a 35 y.o. female here for follow-up.  2-3 mo of brain fog.  Things seem to get worse when she gained weight and started to snore.  She also feels she stops breathing in the middle the night.  She also has a history of depression which has been quite bothersome.  She was seeing psychiatry through the [facility].  No new medications.  She does self medicate with marijuana that does help with anxiety symptoms.  She never does feel well rested. No current exercise. Diet is fair.  She had a head CT and MRI that was largely unremarkable.  Past Medical History:  Diagnosis Date  . Anemia   . Anxiety   . Hiatal hernia   . Migraine    Objective: BP 120/80 (BP Location: Left Arm, Patient Position: Sitting, Cuff Size: Normal)   Pulse 100   Temp 98.4 F (36.9 C) (Oral)   Ht 5\' 9"  (1.753 m)   Wt 220 lb 2 oz (99.8 kg)   SpO2 98%   BMI 32.51 kg/m  General: Awake, appears stated age HEENT: MMM, EOMi Heart: RRR Neuro: DTRs equal and symmetric throughout, no clonus, 5/5 strength throughout, no cerebellar signs Lungs: CTAB, no rales, wheezes or rhonchi. No accessory muscle use Psych: Age appropriate judgment and insight, normal affect and mood  Assessment and Plan: Snoring - Plan: Ambulatory referral to Pulmonology  Brain fog - Plan: CBC, Comprehensive metabolic panel, TSH, T4, free, B12  Depression, major, single episode, moderate (HCC) - Plan: DULoxetine (CYMBALTA) 30 MG capsule  1.  Referral order to sleep team.  Counseled on diet and exercise. 2.  Check labs, see if sleep apnea treatment helps. 3.  Continue with counseling team.  Start Cymbalta.  I will see her in 6 weeks for this. The patient voiced understanding and agreement to the plan.  [place], DO 11/04/20  12:19 PM

## 2020-11-05 ENCOUNTER — Other Ambulatory Visit: Payer: Self-pay | Admitting: Family Medicine

## 2020-11-05 DIAGNOSIS — R739 Hyperglycemia, unspecified: Secondary | ICD-10-CM

## 2020-11-05 DIAGNOSIS — E876 Hypokalemia: Secondary | ICD-10-CM

## 2020-11-05 NOTE — Progress Notes (Signed)
bmp 

## 2020-11-08 ENCOUNTER — Other Ambulatory Visit: Payer: Medicaid Other

## 2020-12-17 ENCOUNTER — Ambulatory Visit (INDEPENDENT_AMBULATORY_CARE_PROVIDER_SITE_OTHER): Payer: Medicaid Other | Admitting: Family Medicine

## 2020-12-17 ENCOUNTER — Other Ambulatory Visit: Payer: Self-pay

## 2020-12-17 ENCOUNTER — Encounter: Payer: Self-pay | Admitting: Family Medicine

## 2020-12-17 VITALS — BP 118/80 | HR 100 | Temp 97.9°F | Resp 16 | Ht 69.0 in | Wt 219.2 lb

## 2020-12-17 DIAGNOSIS — R519 Headache, unspecified: Secondary | ICD-10-CM

## 2020-12-17 DIAGNOSIS — F321 Major depressive disorder, single episode, moderate: Secondary | ICD-10-CM

## 2020-12-17 DIAGNOSIS — G8929 Other chronic pain: Secondary | ICD-10-CM

## 2020-12-17 DIAGNOSIS — N3000 Acute cystitis without hematuria: Secondary | ICD-10-CM

## 2020-12-17 MED ORDER — NITROFURANTOIN MONOHYD MACRO 100 MG PO CAPS: 100.0000 mg | ORAL_CAPSULE | Freq: Two times a day (BID) | ORAL | 0 refills | Status: AC

## 2020-12-17 NOTE — Patient Instructions (Addendum)
Please call [phone] for the sleep evaluation.   Aim to do some physical exertion for 150 minutes per week. This is typically divided into 5 days per week, 30 minutes per day. The activity should be enough to get your heart rate up. Anything is better than nothing if you have time constraints.  Let me know what medication they started you on please.   Someone will reach out regarding your CT head.   Let us know if you need anything.

## 2020-12-17 NOTE — Progress Notes (Signed)
Chief Complaint  Patient presents with  . Follow-up         Subjective: Patient is a 35 y.o. female here for f/u.  Headaches The patient has had chronic headaches for the past several years, worsening over the past few months.  She was started on a medication for depression by the psychiatry team through the [place] but does not remember the name.  This was not helpful for her headaches.  She has associated brain fog.  No other neurologic signs or symptoms.  She does not currently have a headache.  Depression The patient has a history of depression.  I ordered Cymbalta but she did not take it because she saw the psychiatry team through the [place] shortly after.  She does not remember what they put her on.  It was not helpful.  She is not exercising routinely.  She does have thoughts of harming herself but no plans.  She states that she does want to live.  Of note, she has been on Adderall in the past and that has worked well for her brain fog.  She was diagnosed with ADD by physician at [facility].  With the past 4 days, the patient has had burning with urination.  No bleeding, fevers, nausea, vomiting, discharge.  No new sexual partners.  She does not have a history of recurrent UTI.  Past Medical History:  Diagnosis Date  . Anemia   . Anxiety   . Hiatal hernia   . Migraine     Objective: BP 118/80 (BP Location: Left Arm, Patient Position: Sitting, Cuff Size: Normal)   Pulse 100   Temp 97.9 F (36.6 C) (Oral)   Resp 16   Ht 5\' 9"  (1.753 m)   Wt 219 lb 4 oz (99.5 kg)   LMP  (LMP Unknown)   SpO2 99%   BMI 32.38 kg/m  General: Awake, appears stated age Lungs:  No accessory muscle use Psych: Age appropriate judgment and insight, normal affect and mood  Assessment and Plan: Chronic intractable headache, unspecified headache type - Plan: CT Head Wo Contrast  Depression, major, single episode, moderate (HCC)  Acute cystitis without hematuria - Plan: nitrofurantoin,  macrocrystal-monohydrate, (MACROBID) 100 MG capsule  1.  Check CT head given the recent change.  Could refer to neurology or start a daily medication.  Likely will do both. 2.  I need to find out what medicine they put her on.  An SNRI would still be reasonable.  Continue with the counseling team.  For now continue with the psychiatry team through the .  I would like records from [place] regarding her Adderall usage.  I am not opposed to prescribing this.  Depending on the records, I may want her to have a formal evaluation. 3.  5 days of Macrobid.  Send message if not improving. I would like to see her in around a month. The patient voiced understanding and agreement to the plan.  [place] Seymour, DO 12/17/20  11:57 AM

## 2020-12-23 ENCOUNTER — Other Ambulatory Visit: Payer: Self-pay | Admitting: Family Medicine

## 2020-12-23 DIAGNOSIS — G8929 Other chronic pain: Secondary | ICD-10-CM

## 2020-12-23 MED ORDER — NORTRIPTYLINE HCL 25 MG PO CAPS: 25.0000 mg | ORAL_CAPSULE | Freq: Every day | ORAL | 2 refills | Status: DC

## 2020-12-24 ENCOUNTER — Other Ambulatory Visit: Payer: Self-pay | Admitting: Family Medicine

## 2020-12-24 MED ORDER — AMPHETAMINE-DEXTROAMPHETAMINE 10 MG PO TABS: 10.0000 mg | ORAL_TABLET | Freq: Two times a day (BID) | ORAL | 0 refills | Status: DC

## 2020-12-26 ENCOUNTER — Telehealth: Payer: Self-pay | Admitting: Family Medicine

## 2020-12-26 MED ORDER — AMPHETAMINE-DEXTROAMPHETAMINE 10 MG PO TABS: 10.0000 mg | ORAL_TABLET | Freq: Two times a day (BID) | ORAL | 0 refills | Status: DC

## 2020-12-26 NOTE — Telephone Encounter (Signed)
Medication was set to print-please correct and re-send to the pharmacy  amphetamine-dextroamphetamine (ADDERALL) 10 MG tablet [[id number]]    [id number] [place], [place] - [address] AT [facility] OF [facility] & [address]  [address] [place], [place] [address]  Phone:  [phone] Fax:  [phone]

## 2021-01-04 ENCOUNTER — Ambulatory Visit (HOSPITAL_BASED_OUTPATIENT_CLINIC_OR_DEPARTMENT_OTHER)
Admission: RE | Admit: 2021-01-04 | Discharge: 2021-01-04 | Disposition: A | Discharge status: Home / Self Care | Payer: Medicaid Other | Source: Ambulatory Visit | Attending: Family Medicine | Admitting: Family Medicine

## 2021-01-04 ENCOUNTER — Other Ambulatory Visit: Payer: Self-pay

## 2021-01-04 DIAGNOSIS — G8929 Other chronic pain: Secondary | ICD-10-CM | POA: Diagnosis present

## 2021-01-04 DIAGNOSIS — R519 Headache, unspecified: Secondary | ICD-10-CM | POA: Insufficient documentation

## 2021-01-18 ENCOUNTER — Encounter: Payer: Self-pay | Admitting: Nurse Practitioner

## 2021-01-18 ENCOUNTER — Telehealth: Payer: Medicaid Other | Admitting: Nurse Practitioner

## 2021-01-18 DIAGNOSIS — N760 Acute vaginitis: Secondary | ICD-10-CM | POA: Diagnosis not present

## 2021-01-18 DIAGNOSIS — B9689 Other specified bacterial agents as the cause of diseases classified elsewhere: Secondary | ICD-10-CM | POA: Diagnosis not present

## 2021-01-18 MED ORDER — METRONIDAZOLE 500 MG PO TABS: 500.0000 mg | ORAL_TABLET | Freq: Two times a day (BID) | ORAL | 0 refills | Status: DC

## 2021-01-18 NOTE — Progress Notes (Signed)
Virtual Visit Consent   Crystal Bradley, you are scheduled for a virtual visit with Mary-Margaret Daphine Deutscher, FNP, a [facility] provider, today.     Just as with appointments in the office, your consent must be obtained to participate.  Your consent will be active for this visit and any virtual visit you may have with one of our providers in the next 365 days.     If you have a MyChart account, a copy of this consent can be sent to you electronically.  All virtual visits are billed to your insurance company just like a traditional visit in the office.    As this is a virtual visit, video technology does not allow for your provider to perform a traditional examination.  This may limit your provider's ability to fully assess your condition.  If your provider identifies any concerns that need to be evaluated in person or the need to arrange testing (such as labs, EKG, etc.), we will make arrangements to do so.     Although advances in technology are sophisticated, we cannot ensure that it will always work on either your end or our end.  If the connection with a video visit is poor, the visit may have to be switched to a telephone visit.  With either a video or telephone visit, we are not always able to ensure that we have a secure connection.     I need to obtain your verbal consent now.   Are you willing to proceed with your visit today? yes   Crystal Bradley has provided verbal consent on 01/18/2021 for a virtual visit (video or telephone).   Mary-Margaret Daphine Deutscher, FNP   Date: 01/18/2021 1:01 PM   Virtual Visit via Video Note   I, Mary-Margaret Daphine Deutscher, connected withNAME@ ([id number], [email or web address]) on 01/18/21 at 12:45 PM EDT by a video-enabled telemedicine application and verified that I am speaking with the correct person using two identifiers.  Location: Patient: Virtual Visit Location Patient: Home Provider: Virtual Visit Location Provider: Mobile   I discussed the limitations of evaluation  and management by telemedicine and the availability of in person appointments. The patient expressed understanding and agreed to proceed.    History of Present Illness: Crystal Bradley is a 35 y.o. who identifies as a female who was assigned female at birth, and is being seen today for vaginal discharge.  HPI: HPI patient states she is prone to bacterial vaginosis.she has not had it in several months. She states she got in a friends hot tube a few days ago and now she has a fish smelling discharge. Problems:  Patient Active Problem List   Diagnosis Date Noted   Depression, major, single episode, moderate (HCC) 12/17/2020   Chronic intractable headache 12/17/2020   Severe major depression with psychotic features, mood-congruent (HCC) 09/13/2018   Vitamin D deficiency 11/03/2017   GAD (generalized anxiety disorder) 08/27/2017    Allergies:  Allergies  Allergen Reactions   Latex Swelling   Shellfish-Derived Products Swelling   Ativan [Lorazepam]     Adverse reactions/ agitation   Hydroxyzine     Adverse reactions/agitation   Latex    Medications:  Current Outpatient Medications:    metroNIDAZOLE (FLAGYL) 500 MG tablet, Take 1 tablet (500 mg total) by mouth 2 (two) times daily., Disp: 14 tablet, Rfl: 0   amphetamine-dextroamphetamine (ADDERALL) 10 MG tablet, Take 1 tablet (10 mg total) by mouth 2 (two) times daily., Disp: 60 tablet, Rfl: 0   etonogestrel-ethinyl estradiol (NUVARING) 0.12-0.015 MG/24HR  vaginal ring, Insert vaginally and leave in place for 3 consecutive weeks, then remove for 1 week., Disp: 3 each, Rfl: 12   fluticasone (FLONASE) 50 MCG/ACT nasal spray, Place 2 sprays into both nostrils daily., Disp: 16 g, Rfl: 2   nortriptyline (PAMELOR) 25 MG capsule, Take 1 capsule (25 mg total) by mouth at bedtime., Disp: 30 capsule, Rfl: 2  Observations/Objective: Patient is well-developed, well-nourished in no acute distress.  Resting comfortably  at home.  Head is normocephalic,  atraumatic.  No labored breathing.  Speech is clear and coherent with logical content.  Patient is alert and oriented at baseline.    Assessment and Plan:  Ivar Drape in today with chief complaint of No chief complaint on file.   1. Bacterial vaginosis No douching Avoid bubble baths Avoid hot tubes  Meds ordered this encounter  Medications   metroNIDAZOLE (FLAGYL) 500 MG tablet    Sig: Take 1 tablet (500 mg total) by mouth 2 (two) times daily.    Dispense:  14 tablet    Refill:  0    Order Specific Question:   Supervising Provider    Answer:   Eber Hong [[id number]]         Follow Up Instructions: I discussed the assessment and treatment plan with the patient. The patient was provided an opportunity to ask questions and all were answered. The patient agreed with the plan and demonstrated an understanding of the instructions.  A copy of instructions were sent to the patient via MyChart.  The patient was advised to call back or seek an in-person evaluation if the symptoms worsen or if the condition fails to improve as anticipated.  Time:  I spent 10 minutes with the patient via telehealth technology discussing the above problems/concerns.    Mary-Margaret Daphine Deutscher, FNP

## 2021-01-27 ENCOUNTER — Telehealth (INDEPENDENT_AMBULATORY_CARE_PROVIDER_SITE_OTHER): Payer: Medicaid Other | Admitting: Family Medicine

## 2021-01-27 ENCOUNTER — Other Ambulatory Visit: Payer: Self-pay

## 2021-01-27 ENCOUNTER — Encounter: Payer: Self-pay | Admitting: Family Medicine

## 2021-01-27 DIAGNOSIS — F323 Major depressive disorder, single episode, severe with psychotic features: Secondary | ICD-10-CM

## 2021-01-27 DIAGNOSIS — R41840 Attention and concentration deficit: Secondary | ICD-10-CM | POA: Diagnosis not present

## 2021-01-27 MED ORDER — AMPHETAMINE-DEXTROAMPHETAMINE 10 MG PO TABS: 10.0000 mg | ORAL_TABLET | Freq: Two times a day (BID) | ORAL | 0 refills | Status: DC

## 2021-01-27 MED ORDER — NORTRIPTYLINE HCL 25 MG PO CAPS: 25.0000 mg | ORAL_CAPSULE | Freq: Every day | ORAL | 2 refills | Status: DC

## 2021-01-27 MED ORDER — PREDNISONE 5 MG (21) PO TBPK: ORAL_TABLET | ORAL | 0 refills | Status: DC

## 2021-01-27 NOTE — Progress Notes (Signed)
Chief Complaint  Patient presents with   Follow-up   Headache    Subjective: Patient is a 35 y.o. female here for follow-up. Due to COVID-19 pandemic, we are interacting via web portal for an electronic face-to-face visit. I verified patient's ID using 2 identifiers. Patient agreed to proceed with visit via this method. Patient is at home, I am at office. Patient and I are present for visit.   Patient was started on Adderall 10 mg twice daily.  She reports this has been extremely helpful for her concentration.  She is trying to set up a formal evaluation through the [place] but that has been difficult.  She is not having any side effects with the medication.  Headaches The patient is currently taking nortriptyline 25 mg daily.  She has actually have a headache right now.  Overall the frequency and severity of her headaches is decreased since starting the medication.  No adverse effects.  Her mood is also improved on the medication.  Past Medical History:  Diagnosis Date   Anemia    Anxiety    Hiatal hernia    Migraine     Objective: No conversational dyspnea Age appropriate judgment and insight Nml affect and mood   Assessment and Plan: Inattention  Severe major depression with psychotic features, mood-congruent (HCC)  Chronic, stable.  Continue Adderall 10 mg twice daily, she is setting up a formal evaluation. Chronic, currently stable.  Continue nortriptyline 25 mg daily.  Could increase to 50 mg daily if needed in the future. The patient voiced understanding and agreement to the plan.  Jilda Roche [place], DO 01/27/21  12:14 PM

## 2021-02-09 NOTE — Progress Notes (Signed)
02/10/21- 34 yoF former Smoker (quit 2 months ago) , disabled [place], for sleep evaluation courtesy of Dr Carmelia Roller with concern of snoring Medical problem list includes Migraine, Depression, Anxiety, TBI, PTSD Med list includes Adderall 10 mg bid, nortriptyline 25,  Epworth score- 0 Body weight today-213 lbs Covid vax-2 [employer] Sleeping alone with 35 yo dtr t home. Aware of snoring over past year. Occ wakes gasping. About 2x/ week aware of nightmares with kicking, punching. No sleep walking.   Started Pamelor 1 month ago- hoping this will help.  Adderall helps focus/ concentration, but denies oppressive sleepiness. Occ caffeine. Sleep schedule can be irregular.  ENT- repaired blow-out R orbital fx. Still has tonsils. Denies heart/ lung probs. Not pregnant.  Prior to Admission medications   Medication Sig Start Date End Date Taking? Authorizing Provider  amphetamine-dextroamphetamine (ADDERALL) 10 MG tablet Take 1 tablet (10 mg total) by mouth 2 (two) times daily. Patient taking differently: Take 10 mg by mouth as needed. 01/27/21  Yes Sharlene Dory, DO  etonogestrel-ethinyl estradiol (NUVARING) 0.12-0.015 MG/24HR vaginal ring Insert vaginally and leave in place for 3 consecutive weeks, then remove for 1 week. 11/04/20  Yes Sharlene Dory, DO  fluticasone (FLONASE) 50 MCG/ACT nasal spray Place 2 sprays into both nostrils daily. 11/04/20  Yes Sharlene Dory, DO  nortriptyline (PAMELOR) 25 MG capsule Take 1 capsule (25 mg total) by mouth at bedtime. Patient taking differently: Take 25 mg by mouth as needed. 01/27/21  Yes Wendling, Jilda Roche, DO  predniSONE (STERAPRED UNI-PAK 21 TAB) 5 MG (21) TBPK tablet Follow instructions on package. 01/27/21   Sharlene Dory, DO   Past Medical History:  Diagnosis Date   Anemia    Anxiety    Hiatal hernia    Migraine    Past Surgical History:  Procedure Laterality Date   EYE SURGERY     Family History  Problem  Relation Age of Onset   Cancer Mother        MM   Breast cancer Maternal Aunt    Autoimmune disease Neg Hx    Social History   Socioeconomic History   Marital status: Single    Spouse name: Not on file   Number of children: 1   Years of education: Not on file   Highest education level: Not on file  Occupational History   Not on file  Tobacco Use   Smoking status: Former    Packs/day: 0.50    Pack years: 0.00    Types: Cigarettes   Smokeless tobacco: Never   Tobacco comments:    Pt states quitting I.5 months ago [facility] 02/10/21  Vaping Use   Vaping Use: Every day  Substance and Sexual Activity   Alcohol use: Yes    Comment: social   Drug use: No   Sexual activity: Yes    Birth control/protection: None  Other Topics Concern   Not on file  Social History Narrative   ** Merged History Encounter **       Social Determinants of Health   Financial Resource Strain: Not on file  Food Insecurity: Not on file  Transportation Needs: Not on file  Physical Activity: Not on file  Stress: Not on file  Social Connections: Not on file  Intimate Partner Violence: Not on file   ROS-see HPI   + = positive Constitutional:    weight loss, night sweats, fevers, chills, fatigue, lassitude. HEENT:    headaches, difficulty swallowing, tooth/dental problems, sore throat,  sneezing, itching, ear ache, +nasal congestion, post nasal drip, snoring CV:    chest pain, orthopnea, PND, swelling in lower extremities, anasarca,       dizziness, palpitations Resp:   +shortness of breath with exertion or at rest.                productive cough,   non-productive cough, coughing up of blood.              change in color of mucus.  wheezing.   Skin:    rash or lesions. GI:  No-   heartburn, indigestion, abdominal pain, nausea, vomiting, diarrhea,                 change in bowel habits, loss of appetite GU: dysuria, change in color of urine, no urgency or frequency.   flank pain. MS:   joint pain,  stiffness, decreased range of motion, back pain. Neuro-     nothing unusual Psych:  change in mood or affect.  +depression or +anxiety.   memory loss.  OBJ- Physical Exam----- seemed to be trying not to yawn or cry General- Alert, Oriented, Affect-appropriate, Distress- none acute, +overweight Skin- rash-none, lesions- none, excoriation- none Lymphadenopathy- none Head- atraumatic            Eyes- Gross vision intact, PERRLA, conjunctivae and secretions clear            Ears- Hearing, canals-normal            Nose- Clear, no-Septal dev, mucus, polyps, erosion, perforation             Throat- Mallampati II-III , mucosa clear , drainage- none, tonsils- atrophic,  + teeth Neck- flexible , trachea midline, no stridor , thyroid nl, carotid no bruit Chest - symmetrical excursion , unlabored           Heart/CV- RRR , no murmur , no gallop  , no rub, nl s1 s2                           - JVD- none , edema- none, stasis changes- none, varices- none           Lung- clear to P&A, wheeze- none, cough- none , dullness-none, rub- none           Chest wall-  Abd-  Br/ Gen/ Rectal- Not done, not indicated Extrem- cyanosis- none, clubbing, none, atrophy- none, strength- nl Neuro- grossly intact to observation

## 2021-02-10 ENCOUNTER — Other Ambulatory Visit: Payer: Self-pay

## 2021-02-10 ENCOUNTER — Encounter: Payer: Self-pay | Admitting: Internal Medicine

## 2021-02-10 ENCOUNTER — Ambulatory Visit (INDEPENDENT_AMBULATORY_CARE_PROVIDER_SITE_OTHER): Payer: Medicaid Other | Admitting: Internal Medicine

## 2021-02-10 VITALS — BP 128/78 | HR 90 | Temp 98.8°F | Ht 69.0 in | Wt 213.0 lb

## 2021-02-10 DIAGNOSIS — F323 Major depressive disorder, single episode, severe with psychotic features: Secondary | ICD-10-CM | POA: Diagnosis not present

## 2021-02-10 DIAGNOSIS — R0683 Snoring: Secondary | ICD-10-CM | POA: Insufficient documentation

## 2021-02-10 NOTE — Patient Instructions (Addendum)
Order- schedule home sleep test    dx Snoring  Please call us for results about 2 weeks after your sleep test. If appropriate, we may be able to start treatment before we see you next.

## 2021-02-10 NOTE — Assessment & Plan Note (Signed)
Suspect obstructive sleep apnea. Nocturnal arousals with suggestion of REM Behavior Disorder. In-center sleep study would be difficult for her and she doubts she could sleep, so we are settling for HST at this time. Appropriate discussion and questions answered. Plan- HST, then maybe CPAP to start.

## 2021-02-10 NOTE — Assessment & Plan Note (Signed)
Recognize potential relationship to sleep issues.

## 2021-03-12 ENCOUNTER — Ambulatory Visit: Payer: Medicaid Other

## 2021-03-12 ENCOUNTER — Other Ambulatory Visit: Payer: Self-pay

## 2021-03-12 DIAGNOSIS — R0683 Snoring: Secondary | ICD-10-CM

## 2021-03-14 DIAGNOSIS — R0683 Snoring: Secondary | ICD-10-CM

## 2021-03-28 ENCOUNTER — Other Ambulatory Visit: Payer: Self-pay

## 2021-03-31 MED ORDER — AMPHETAMINE-DEXTROAMPHETAMINE 10 MG PO TABS: 10.0000 mg | ORAL_TABLET | Freq: Two times a day (BID) | ORAL | 0 refills | Status: DC

## 2021-03-31 NOTE — Telephone Encounter (Signed)
Last OV--01/27/2021 Last RF--#60 no refills

## 2021-05-15 NOTE — Progress Notes (Deleted)
02/10/21- 34 yoF former Smoker (quit 2 months ago) , disabled [place], for sleep evaluation courtesy of Dr Carmelia Roller with concern of snoring Medical problem list includes Migraine, Depression, Anxiety, TBI, PTSD Med list includes Adderall 10 mg bid, nortriptyline 25,  Epworth score- 0 Body weight today-213 lbs Covid vax-2 Moderna Sleeping alone with 37 yo dtr t home. Aware of snoring over past year. Occ wakes gasping. About 2x/ week aware of nightmares with kicking, punching. No sleep walking.   Started Pamelor 1 month ago- hoping this will help.  Adderall helps focus/ concentration, but denies oppressive sleepiness. Occ caffeine. Sleep schedule can be irregular.  ENT- repaired blow-out R orbital fx. Still has tonsils. Denies heart/ lung probs. Not pregnant.  05/16/21- 34 yoF former smoker, disabled Veteran, followed for sleep disturbance with snoring, complicated by Migraine, Depression, Anxiety, TBI, PTSD HST- 03/12/21- AHI 3/ hr, desaturation to 82%, body weight 213 lbs Med list includes Adderall 10 mg bid, nortriptyline 25, Body weight today- Covid vax- Flu vax-   ROS-see HPI   + = positive Constitutional:    weight loss, night sweats, fevers, chills, fatigue, lassitude. HEENT:    headaches, difficulty swallowing, tooth/dental problems, sore throat,       sneezing, itching, ear ache, +nasal congestion, post nasal drip, snoring CV:    chest pain, orthopnea, PND, swelling in lower extremities, anasarca,       dizziness, palpitations Resp:   +shortness of breath with exertion or at rest.                productive cough,   non-productive cough, coughing up of blood.              change in color of mucus.  wheezing.   Skin:    rash or lesions. GI:  No-   heartburn, indigestion, abdominal pain, nausea, vomiting, diarrhea,                 change in bowel habits, loss of appetite GU: dysuria, change in color of urine, no urgency or frequency.   flank pain. MS:   joint pain, stiffness,  decreased range of motion, back pain. Neuro-     nothing unusual Psych:  change in mood or affect.  +depression or +anxiety.   memory loss.  OBJ- Physical Exam----- seemed to be trying not to yawn or cry General- Alert, Oriented, Affect-appropriate, Distress- none acute, +overweight Skin- rash-none, lesions- none, excoriation- none Lymphadenopathy- none Head- atraumatic            Eyes- Gross vision intact, PERRLA, conjunctivae and secretions clear            Ears- Hearing, canals-normal            Nose- Clear, no-Septal dev, mucus, polyps, erosion, perforation             Throat- Mallampati II-III , mucosa clear , drainage- none, tonsils- atrophic,  + teeth Neck- flexible , trachea midline, no stridor , thyroid nl, carotid no bruit Chest - symmetrical excursion , unlabored           Heart/CV- RRR , no murmur , no gallop  , no rub, nl s1 s2                           - JVD- none , edema- none, stasis changes- none, varices- none           Lung- clear to P&A, wheeze- none, cough-  none , dullness-none, rub- none           Chest wall-  Abd-  Br/ Gen/ Rectal- Not done, not indicated Extrem- cyanosis- none, clubbing, none, atrophy- none, strength- nl Neuro- grossly intact to observation

## 2021-05-16 ENCOUNTER — Ambulatory Visit: Payer: Medicaid Other | Admitting: Internal Medicine

## 2021-05-22 ENCOUNTER — Other Ambulatory Visit: Payer: Self-pay

## 2021-05-23 MED ORDER — AMPHETAMINE-DEXTROAMPHETAMINE 10 MG PO TABS: 10.0000 mg | ORAL_TABLET | Freq: Two times a day (BID) | ORAL | 0 refills | Status: DC

## 2021-05-23 NOTE — Telephone Encounter (Signed)
2 mo sent, please sched her CPE around late Dec. Ty.

## 2021-05-26 NOTE — Telephone Encounter (Signed)
Called left detailed message of PCP instructions. 

## 2021-07-04 ENCOUNTER — Other Ambulatory Visit: Payer: Self-pay | Admitting: Family Medicine

## 2021-07-04 ENCOUNTER — Encounter: Payer: Self-pay | Admitting: Family Medicine

## 2021-07-04 MED ORDER — AMPHETAMINE-DEXTROAMPHETAMINE 10 MG PO TABS: 10.0000 mg | ORAL_TABLET | Freq: Two times a day (BID) | ORAL | 0 refills | Status: DC

## 2021-07-04 MED ORDER — ETONOGESTREL-ETHINYL ESTRADIOL 0.12-0.015 MG/24HR VA RING: VAGINAL_RING | VAGINAL | 12 refills | Status: DC

## 2021-07-04 MED ORDER — FLUTICASONE PROPIONATE 50 MCG/ACT NA SUSP: 2.0000 | Freq: Every day | NASAL | 2 refills | Status: DC

## 2021-08-13 ENCOUNTER — Encounter: Payer: Medicaid Other | Admitting: Family Medicine

## 2021-08-18 ENCOUNTER — Encounter: Payer: Self-pay | Admitting: Family Medicine

## 2021-08-18 ENCOUNTER — Ambulatory Visit (INDEPENDENT_AMBULATORY_CARE_PROVIDER_SITE_OTHER): Payer: Medicaid Other | Admitting: Family Medicine

## 2021-08-18 VITALS — BP 118/64 | HR 88 | Temp 98.2°F | Ht 69.0 in | Wt 216.0 lb

## 2021-08-18 DIAGNOSIS — Z0001 Encounter for general adult medical examination with abnormal findings: Secondary | ICD-10-CM

## 2021-08-18 DIAGNOSIS — R300 Dysuria: Secondary | ICD-10-CM

## 2021-08-18 DIAGNOSIS — Z23 Encounter for immunization: Secondary | ICD-10-CM

## 2021-08-18 DIAGNOSIS — Z0000 Encounter for general adult medical examination without abnormal findings: Secondary | ICD-10-CM

## 2021-08-18 DIAGNOSIS — R002 Palpitations: Secondary | ICD-10-CM

## 2021-08-18 LAB — COMPREHENSIVE METABOLIC PANEL
ALT: 14 U/L (ref 0–35)
AST: 14 U/L (ref 0–37)
Albumin: 4.2 g/dL (ref 3.5–5.2)
Alkaline Phosphatase: 50 U/L (ref 39–117)
BUN: 8 mg/dL (ref 6–23)
CO2: 26 mEq/L (ref 19–32)
Calcium: 9.1 mg/dL (ref 8.4–10.5)
Chloride: 104 mEq/L (ref 96–112)
Creatinine, Ser: 0.82 mg/dL (ref 0.40–1.20)
GFR: 91.62 mL/min (ref >60.00)
Glucose, Bld: 90 mg/dL (ref 70–99)
Potassium: 3.9 mEq/L (ref 3.5–5.1)
Sodium: 138 mEq/L (ref 135–145)
Total Bilirubin: 0.7 mg/dL (ref 0.2–1.2)
Total Protein: 7.4 g/dL (ref 6.0–8.3)

## 2021-08-18 LAB — LIPID PANEL
Cholesterol: 176 mg/dL (ref 0–200)
HDL: 40.6 mg/dL (ref >39.00)
LDL Cholesterol: 116 mg/dL — ABNORMAL HIGH (ref 0–99)
NonHDL: 134.9
Total CHOL/HDL Ratio: 4
Triglycerides: 94 mg/dL (ref 0.0–149.0)
VLDL: 18.8 mg/dL (ref 0.0–40.0)

## 2021-08-18 LAB — URINALYSIS
Bilirubin Urine: NEGATIVE
Hgb urine dipstick: NEGATIVE
Ketones, ur: NEGATIVE
Leukocytes,Ua: NEGATIVE
Nitrite: NEGATIVE
Specific Gravity, Urine: >=1.03 — AB (ref 1.000–1.030)
Urine Glucose: NEGATIVE
Urobilinogen, UA: 0.2 (ref 0.0–1.0)
pH: 6 (ref 5.0–8.0)

## 2021-08-18 LAB — CBC
HCT: 36.3 % (ref 36.0–46.0)
Hemoglobin: 11.7 g/dL — ABNORMAL LOW (ref 12.0–15.0)
MCHC: 32.3 g/dL (ref 30.0–36.0)
MCV: 95 fl (ref 78.0–100.0)
Platelets: 267 10*3/uL (ref 150.0–400.0)
RBC: 3.82 Mil/uL — ABNORMAL LOW (ref 3.87–5.11)
RDW: 13.1 % (ref 11.5–15.5)
WBC: 5.3 10*3/uL (ref 4.0–10.5)

## 2021-08-18 LAB — HEMOGLOBIN A1C: Hgb A1c MFr Bld: 5.1 % (ref 4.6–6.5)

## 2021-08-18 MED ORDER — AMPHETAMINE-DEXTROAMPHETAMINE 10 MG PO TABS: 10.0000 mg | ORAL_TABLET | Freq: Two times a day (BID) | ORAL | 0 refills | Status: DC

## 2021-08-18 MED ORDER — NITROFURANTOIN MONOHYD MACRO 100 MG PO CAPS: 100.0000 mg | ORAL_CAPSULE | Freq: Two times a day (BID) | ORAL | 0 refills | Status: AC

## 2021-08-18 NOTE — Patient Instructions (Addendum)
Give [place] 2-3 business days to get the results of your labs back.   Keep the diet clean and stay active.  Bivalent COVID vaccination booster recommended.   Stay hydrated.   Warning signs/symptoms: Uncontrollable nausea/vomiting, fevers, worsening symptoms despite treatment, confusion.  Give [place] around 2 business days to get culture back to you.  Let us know if you need anything.

## 2021-08-18 NOTE — Progress Notes (Signed)
Chief Complaint  Patient presents with   Annual Exam     Well Woman Crystal Bradley is here for a complete physical.   Her last physical was >1 year ago.  Current diet: in general, diet could be better. Current exercise: none. Contraception? Yes Fatigue out of ordinary? No Seatbelt? Yes Advanced directive? No  Health Maintenance Pap/HPV- Yes Tetanus- No HIV screening- Yes Hep C screening- Yes  UTI Pain w urination over past week. No fevers, bleeding, d/c, incontinence, flank pain. Did have protected intercourse over NYE's so would like to be screened for STI's. No skin lesions or concerns from partner.   Past Medical History:  Diagnosis Date   Anemia    Anxiety    Hiatal hernia    Migraine      Past Surgical History:  Procedure Laterality Date   EYE SURGERY      Medications  Current Outpatient Medications on File Prior to Visit  Medication Sig Dispense Refill   amphetamine-dextroamphetamine (ADDERALL) 10 MG tablet Take 1 tablet (10 mg total) by mouth 2 (two) times daily. 60 tablet 0   amphetamine-dextroamphetamine (ADDERALL) 10 MG tablet Take 1 tablet (10 mg total) by mouth 2 (two) times daily. 60 tablet 0   etonogestrel-ethinyl estradiol (NUVARING) 0.12-0.015 MG/24HR vaginal ring Insert vaginally and leave in place for 3 consecutive weeks, then remove for 1 week. 3 each 12   fluticasone (FLONASE) 50 MCG/ACT nasal spray Place 2 sprays into both nostrils daily. 16 g 2   nortriptyline (PAMELOR) 25 MG capsule Take 1 capsule (25 mg total) by mouth at bedtime. (Patient taking differently: Take 25 mg by mouth as needed.) 90 capsule 2    Allergies Allergies  Allergen Reactions   Latex Swelling   Shellfish-Derived Products Swelling   Ativan [Lorazepam]     Adverse reactions/ agitation   Hydroxyzine     Adverse reactions/agitation   Latex     Review of Systems: Constitutional:  no unexpected weight changes Eye:  no recent significant change in  vision Ear/Nose/Mouth/Throat:  Ears:  no tinnitus or vertigo and no recent change in hearing Nose/Mouth/Throat:  no complaints of nasal congestion, no sore throat Cardiovascular: no chest pain Respiratory:  no cough and no shortness of breath Gastrointestinal:  no abdominal pain, no change in bowel habits GU:  Female: +dysuria Musculoskeletal/Extremities:  no pain of the joints Integumentary (Skin/Breast):  no abnormal skin lesions reported Neurologic:  no headaches Endocrine:  denies fatigue Hematologic/Lymphatic:  No areas of easy bleeding  Exam BP 118/64    Pulse 88    Temp 98.2 F (36.8 C) (Oral)    Ht 5\' 9"  (1.753 m)    Wt 216 lb (98 kg)    SpO2 98%    BMI 31.90 kg/m  General:  well developed, well nourished, in no apparent distress Skin:  no significant moles, warts, or growths Head:  no masses, lesions, or tenderness Eyes:  pupils equal and round, sclera anicteric without injection Ears:  canals without lesions, TMs shiny without retraction, no obvious effusion, no erythema Nose:  nares patent, septum midline, mucosa normal, and no drainage or sinus tenderness Throat/Pharynx:  lips and gingiva without lesion; tongue and uvula midline; non-inflamed pharynx; no exudates or postnasal drainage Neck: neck supple without adenopathy, thyromegaly, or masses Lungs:  clear to auscultation, breath sounds equal bilaterally, no respiratory distress Cardio:  regular rate and rhythm, no bruits, no LE edema Abdomen:  abdomen soft, nontender; bowel sounds normal; no masses or organomegaly Genital:  Defer to GYN Musculoskeletal:  symmetrical muscle groups noted without atrophy or deformity Extremities:  no clubbing, cyanosis, or edema, no deformities, no skin discoloration Neuro:  gait normal; deep tendon reflexes normal and symmetric Psych: well oriented with normal range of affect and appropriate judgment/insight  Assessment and Plan  Well adult exam - Plan: Lipid panel, CBC, Comprehensive  metabolic panel, Hemoglobin A1c  Dysuria - Plan: Urine cytology ancillary only([place]), Urine Culture, Urinalysis, HIV Antibody (routine testing w rflx)  Palpitations - Plan: Ambulatory referral to Cardiology  Need for Tdap vaccination - Plan: Tdap vaccine greater than or equal to 7yo IM   Well 36 y.o. female. Counseled on diet and exercise. Other orders as above. Dysuria- ck above. Will empirically tx w Macrobid 100 mg bid for 5 d.  Pt having continued palpitations, wishing to see [facility] [place] [facility]. Will set up.  Update CSC.  Follow up 6 mo or prn. The patient voiced understanding and agreement to the plan.  Jilda Roche [place], DO 08/18/21 12:45 PM

## 2021-08-20 LAB — URINE CULTURE
MICRO NUMBER:: 12875184
SPECIMEN QUALITY:: ADEQUATE

## 2021-08-20 LAB — HIV ANTIBODY (ROUTINE TESTING W REFLEX): HIV 1&2 Ab, 4th Generation: NONREACTIVE

## 2021-09-03 ENCOUNTER — Other Ambulatory Visit: Payer: Self-pay | Admitting: Family Medicine

## 2021-09-03 ENCOUNTER — Encounter: Payer: Self-pay | Admitting: Family Medicine

## 2021-09-03 MED ORDER — METRONIDAZOLE 500 MG PO TABS: 500.0000 mg | ORAL_TABLET | Freq: Two times a day (BID) | ORAL | 0 refills | Status: AC

## 2021-09-19 DIAGNOSIS — F419 Anxiety disorder, unspecified: Secondary | ICD-10-CM | POA: Insufficient documentation

## 2021-09-19 DIAGNOSIS — K449 Diaphragmatic hernia without obstruction or gangrene: Secondary | ICD-10-CM | POA: Insufficient documentation

## 2021-09-19 DIAGNOSIS — G43909 Migraine, unspecified, not intractable, without status migrainosus: Secondary | ICD-10-CM | POA: Insufficient documentation

## 2021-09-19 DIAGNOSIS — D649 Anemia, unspecified: Secondary | ICD-10-CM | POA: Insufficient documentation

## 2021-09-23 ENCOUNTER — Ambulatory Visit: Payer: Medicaid Other | Admitting: Cardiology

## 2021-10-11 ENCOUNTER — Telehealth: Payer: Medicaid Other | Admitting: Physician Assistant

## 2021-10-11 DIAGNOSIS — R197 Diarrhea, unspecified: Secondary | ICD-10-CM | POA: Diagnosis not present

## 2021-10-11 DIAGNOSIS — R112 Nausea with vomiting, unspecified: Secondary | ICD-10-CM

## 2021-10-11 MED ORDER — ONDANSETRON 8 MG PO TBDP
8.0000 mg | ORAL_TABLET | Freq: Three times a day (TID) | ORAL | 0 refills | Status: DC | PRN
Start: 2021-10-11 — End: 2022-02-17

## 2021-10-11 NOTE — Patient Instructions (Signed)
Crystal Bradley, thank you for joining Roney Jaffe, PA-C for today's virtual visit.  While this provider is not your primary care provider (PCP), if your PCP is located in our provider database this encounter information will be shared with them immediately following your visit.  Consent: (Patient) Crystal Bradley provided verbal consent for this virtual visit at the beginning of the encounter.  Current Medications:  Current Outpatient Medications:    ondansetron (ZOFRAN-ODT) 8 MG disintegrating tablet, Take 1 tablet (8 mg total) by mouth every 8 (eight) hours as needed for nausea or vomiting., Disp: 10 tablet, Rfl: 0   [START ON 10/17/2021] amphetamine-dextroamphetamine (ADDERALL) 10 MG tablet, Take 1 tablet (10 mg total) by mouth 2 (two) times daily., Disp: 60 tablet, Rfl: 0   amphetamine-dextroamphetamine (ADDERALL) 10 MG tablet, Take 1 tablet (10 mg total) by mouth 2 (two) times daily., Disp: 60 tablet, Rfl: 0   amphetamine-dextroamphetamine (ADDERALL) 10 MG tablet, Take 1 tablet (10 mg total) by mouth 2 (two) times daily., Disp: 60 tablet, Rfl: 0   etonogestrel-ethinyl estradiol (NUVARING) 0.12-0.015 MG/24HR vaginal ring, Insert vaginally and leave in place for 3 consecutive weeks, then remove for 1 week., Disp: 3 each, Rfl: 12   fluticasone (FLONASE) 50 MCG/ACT nasal spray, Place 2 sprays into both nostrils daily., Disp: 16 g, Rfl: 2   nortriptyline (PAMELOR) 25 MG capsule, Take 1 capsule (25 mg total) by mouth at bedtime. (Patient taking differently: Take 25 mg by mouth as needed.), Disp: 90 capsule, Rfl: 2   Medications ordered in this encounter:  Meds ordered this encounter  Medications   ondansetron (ZOFRAN-ODT) 8 MG disintegrating tablet    Sig: Take 1 tablet (8 mg total) by mouth every 8 (eight) hours as needed for nausea or vomiting.    Dispense:  10 tablet    Refill:  0    Order Specific Question:   Supervising Provider    Answer:   Hyacinth Meeker, BRIAN [[id number]]     *If you need  refills on other medications prior to your next appointment, please contact your pharmacy*  Follow-Up: Call back or seek an in-person evaluation if the symptoms worsen or if the condition fails to improve as anticipated.  Other Instructions I encourage you to stay well-hydrated, slowly advance diet as tolerated.  I hope that you feel better soon.   If you have been instructed to have an in-person evaluation today at a local Urgent Care facility, please use the link below. It will take you to a list of all of our available [facility], including address, phone number and hours of operation. Please do not delay care.  [facility]  If you or a family member do not have a primary care provider, use the link below to schedule a visit and establish care. When you choose a [facility] primary care physician or advanced practice provider, you gain a long-term partner in health. Find a Primary Care Provider  Learn more about [facility]'s in-office and virtual care options: [facility] - Get Care Now  Food Poisoning Food poisoning is an illness that is caused by eating or drinking contaminated foods or drinks. In most cases, food poisoning is mild and lasts 1-2 days. However, some cases can be serious, especially for people who have weak body defense systems (immune systems), older people, children and infants, and pregnant women. What are the causes? This condition is caused by contaminated food. Foods can become contaminated with viruses, bacteria, parasites, or  mold due to: Poor personal hygiene, such as poor hand-washing practices. Storing food improperly, such as not refrigerating raw meat. Using unclean surfaces for preparing, serving, and storing food. Cooking or eating with unclean utensils. If contaminated food is eaten, viruses, bacteria, or parasites can harm the intestine. This often causes severe diarrhea. The most common causes of food poisoning  include: Viruses, such as: Norovirus. Rotavirus. Bacteria, such as: Salmonella. Listeria. E. coli (Escherichia coli). Parasites, such as: Giardia. Toxoplasma gondii. What are the signs or symptoms? Symptoms may take several hours to appear after you consume contaminated food or drink. Symptoms include: Nausea. Vomiting. Cramping. Diarrhea. Fever and chills. Muscle aches. Dehydration. Dehydration can cause you to be tired and thirsty, have a dry mouth, and urinate less frequently. How is this diagnosed? Your health care provider can diagnose food poisoning with your medical history and a physical exam. This will include asking you what you have recently eaten. You may also have tests, including: Blood tests. Stool tests. How is this treated? Treatment focuses on relieving your symptoms and making sure that you are hydrated. You may also be given medicines. In severe cases, hospitalization may be required and you may need to receive fluids through an IV. Follow these instructions at home: Eating and drinking  Drink enough fluids to keep your urine pale yellow. You may need to drink small amounts of clear liquids frequently. Avoid milk, caffeine, and alcohol. Ask your health care provider for specific rehydration instructions. Eat small, frequent meals rather than large meals. Medicines Take over-the-counter and prescription medicines only as told by your health care provider. Ask your health care provider if you should continue to take any of your regular prescribed and over-the-counter medicines. If you were prescribed an antibiotic medicine, take it as told by your health care provider. Do not stop taking the antibiotic even if you start to feel better. General instructions  Wash your hands thoroughly before you prepare food and after you go to the bathroom (use the toilet). Make sure that the people who live with you also wash their hands often. Rest at home until you feel  better. Clean surfaces that you touch with a product that contains chlorine bleach. Keep all follow-up visits as told by your health care provider. This is important. How is this prevented? Wash your hands, food preparation surfaces, and utensils thoroughly before and after you handle raw foods. Use separate food preparation surfaces and storage spaces for raw meat and for fruits and vegetables. Keep refrigerated foods colder than 4F (5C). Serve hot foods immediately or keep them heated above 14F (60C). Store dry foods in cool, dry spaces away from excess heat or moisture. Throw out any foods that do not smell right or are in cans that are bulging. Follow approved canning procedures. Heat canned foods thoroughly before you taste them. Drink bottled or sterile water when you travel. Get help right away if: You have difficulty breathing, swallowing, talking, or moving. You develop blurred vision. You cannot eat or drink without vomiting. You faint. Your eyes turn yellow. Your vomiting or diarrhea is persistent. Abdominal pain develops, increases, or localizes in one small area. You have a fever. You have blood or mucus in your stools, or your stools look dark black and tarry. You have signs of dehydration, such as: Dark urine, very little urine, or no urine. Cracked lips. Not making tears while crying. Dry mouth. Sunken eyes. Sleepiness. Weakness. Dizziness. These symptoms may represent a serious problem that is  an emergency. Do not wait to see if the symptoms will go away. Get medical help right away. Call your local emergency services (911 in the [place] Do not drive yourself to the hospital. Summary Food poisoning is an illness that is caused by eating or drinking contaminated foods or drinks. Symptoms may include nausea, vomiting, diarrhea, muscle aches, cramping, fever, chills, and dehydration. In most cases, food poisoning is mild and lasts 1-2 days. In severe cases,  hospitalization may be required. This information is not intended to replace advice given to you by your health care provider. Make sure you discuss any questions you have with your health care provider. Document Revised: 05/04/2018 Document Reviewed: 05/04/2018 Elsevier Patient Education  2022 [employer]

## 2021-10-11 NOTE — Progress Notes (Signed)
?Virtual Visit Consent  ? ?Ivar Drape, you are scheduled for a virtual visit with a [facility] provider today.   ?  ?Just as with appointments in the office, your consent must be obtained to participate.  Your consent will be active for this visit and any virtual visit you may have with one of our providers in the next 365 days.   ?  ?If you have a MyChart account, a copy of this consent can be sent to you electronically.  All virtual visits are billed to your insurance company just like a traditional visit in the office.   ? ?As this is a virtual visit, video technology does not allow for your provider to perform a traditional examination.  This may limit your provider's ability to fully assess your condition.  If your provider identifies any concerns that need to be evaluated in person or the need to arrange testing (such as labs, EKG, etc.), we will make arrangements to do so.   ?  ?Although advances in technology are sophisticated, we cannot ensure that it will always work on either your end or our end.  If the connection with a video visit is poor, the visit may have to be switched to a telephone visit.  With either a video or telephone visit, we are not always able to ensure that we have a secure connection.    ? ?I need to obtain your verbal consent now.   Are you willing to proceed with your visit today?  ?  ?Crystal Bradley has provided verbal consent on 10/11/2021 for a virtual visit (video or telephone). ?  ?Roney Jaffe, PA-C  ? ?Date: 10/11/2021 1:08 PM ? ? ?Virtual Visit via Video Note  ? ?IRoney Jaffe, connected with  Crystal Bradley  ([id number], Jun 16, 1986) on 10/11/21 at  1:00 PM EST by a video-enabled telemedicine application and verified that I am speaking with the correct person using two identifiers. ? ?Location: ?Patient: Virtual Visit Location Patient: Home ?Provider: Virtual Visit Location Provider: Home Office ?  ?I discussed the limitations of evaluation and management by  telemedicine and the availability of in person appointments. The patient expressed understanding and agreed to proceed.   ? ?History of Present Illness: ?Crystal Bradley is a 36 y.o. who identifies as a female who was assigned female at birth, states that she started having some abdominal discomfort last night, thought that she just was "gassy".  States that she woke up this morning and drank [employer] to help her bowels move, states that she then did have a bowel movement and immediately started feeling pain in her stomach, states that she then started having vomiting and diarrhea at the same time.  States that she has not tried having anything to eat or having anything to drink since then.  States the last thing she ate was a salad from [employer]. ? ?HPI: HPI  ?Problems:  ?Patient Active Problem List  ? Diagnosis Date Noted  ? Migraine 09/19/2021  ? Hiatal hernia 09/19/2021  ? Anxiety 09/19/2021  ? Anemia 09/19/2021  ? Snoring 02/10/2021  ? Depression, major, single episode, moderate (HCC) 12/17/2020  ? Chronic intractable headache 12/17/2020  ? Severe major depression with psychotic features, mood-congruent (HCC) 09/13/2018  ? Vitamin D deficiency 11/03/2017  ? GAD (generalized anxiety disorder) 08/27/2017  ?  ?Allergies:  ?Allergies  ?Allergen Reactions  ? Latex Swelling  ? Shellfish-Derived Products Swelling  ? Ativan [Lorazepam]   ?  Adverse reactions/ agitation  ?  Hydroxyzine   ?  Adverse reactions/agitation  ? Latex   ? ?Medications:  ?Current Outpatient Medications:  ?  ondansetron (ZOFRAN-ODT) 8 MG disintegrating tablet, Take 1 tablet (8 mg total) by mouth every 8 (eight) hours as needed for nausea or vomiting., Disp: 10 tablet, Rfl: 0 ?  [START ON 10/17/2021] amphetamine-dextroamphetamine (ADDERALL) 10 MG tablet, Take 1 tablet (10 mg total) by mouth 2 (two) times daily., Disp: 60 tablet, Rfl: 0 ?  amphetamine-dextroamphetamine (ADDERALL) 10 MG tablet, Take 1 tablet (10 mg total) by mouth 2 (two) times  daily., Disp: 60 tablet, Rfl: 0 ?  amphetamine-dextroamphetamine (ADDERALL) 10 MG tablet, Take 1 tablet (10 mg total) by mouth 2 (two) times daily., Disp: 60 tablet, Rfl: 0 ?  etonogestrel-ethinyl estradiol (NUVARING) 0.12-0.015 MG/24HR vaginal ring, Insert vaginally and leave in place for 3 consecutive weeks, then remove for 1 week., Disp: 3 each, Rfl: 12 ?  fluticasone (FLONASE) 50 MCG/ACT nasal spray, Place 2 sprays into both nostrils daily., Disp: 16 g, Rfl: 2 ?  nortriptyline (PAMELOR) 25 MG capsule, Take 1 capsule (25 mg total) by mouth at bedtime. (Patient taking differently: Take 25 mg by mouth as needed.), Disp: 90 capsule, Rfl: 2 ? ?Observations/Objective: ?Patient did not leave video on due to being in the bathroom. Medical history and current medications reviewed, no physical exam completed ? ?Assessment and Plan: ?1. Diarrhea, unspecified type ? ?2. Nausea and vomiting, unspecified vomiting type ?- ondansetron (ZOFRAN-ODT) 8 MG disintegrating tablet; Take 1 tablet (8 mg total) by mouth every 8 (eight) hours as needed for nausea or vomiting.  Dispense: 10 tablet; Refill: 0 ? ?Patient education given on supportive care, red flags for prompt reevaluation. ? ?Follow Up Instructions: ?I discussed the assessment and treatment plan with the patient. The patient was provided an opportunity to ask questions and all were answered. The patient agreed with the plan and demonstrated an understanding of the instructions.  A copy of instructions were sent to the patient via MyChart unless otherwise noted below.  ? ? ? ?The patient was advised to call back or seek an in-person evaluation if the symptoms worsen or if the condition fails to improve as anticipated. ? ?Time:  ?I spent 12 minutes with the patient via telehealth technology discussing the above problems/concerns.   ? ?Khaden Gater S Mayers, PA-C ? ?

## 2021-12-12 ENCOUNTER — Encounter: Payer: Self-pay | Admitting: Family Medicine

## 2022-02-02 ENCOUNTER — Other Ambulatory Visit: Payer: Self-pay | Admitting: Family Medicine

## 2022-02-02 ENCOUNTER — Telehealth: Payer: Medicaid Other | Admitting: Physician Assistant

## 2022-02-02 ENCOUNTER — Encounter: Payer: Self-pay | Admitting: Family Medicine

## 2022-02-02 DIAGNOSIS — B379 Candidiasis, unspecified: Secondary | ICD-10-CM

## 2022-02-02 DIAGNOSIS — T3695XA Adverse effect of unspecified systemic antibiotic, initial encounter: Secondary | ICD-10-CM

## 2022-02-02 DIAGNOSIS — N760 Acute vaginitis: Secondary | ICD-10-CM | POA: Diagnosis not present

## 2022-02-02 DIAGNOSIS — B9689 Other specified bacterial agents as the cause of diseases classified elsewhere: Secondary | ICD-10-CM | POA: Diagnosis not present

## 2022-02-02 MED ORDER — METRONIDAZOLE 500 MG PO TABS: 500.0000 mg | ORAL_TABLET | Freq: Two times a day (BID) | ORAL | 0 refills | Status: AC

## 2022-02-02 MED ORDER — FLUCONAZOLE 150 MG PO TABS: 150.0000 mg | ORAL_TABLET | ORAL | 0 refills | Status: DC | PRN

## 2022-02-02 MED ORDER — ETONOGESTREL-ETHINYL ESTRADIOL 0.12-0.015 MG/24HR VA RING: VAGINAL_RING | VAGINAL | 12 refills | Status: DC

## 2022-02-02 MED ORDER — AMPHETAMINE-DEXTROAMPHETAMINE 10 MG PO TABS: 10.0000 mg | ORAL_TABLET | Freq: Two times a day (BID) | ORAL | 0 refills | Status: DC

## 2022-02-02 NOTE — Patient Instructions (Signed)
Crystal Bradley, thank you for joining Margaretann Loveless, PA-C for today's virtual visit.  While this provider is not your primary care provider (PCP), if your PCP is located in our provider database this encounter information will be shared with them immediately following your visit.  Consent: (Patient) Crystal Bradley provided verbal consent for this virtual visit at the beginning of the encounter.  Current Medications:  Current Outpatient Medications:    fluconazole (DIFLUCAN) 150 MG tablet, Take 1 tablet (150 mg total) by mouth every 3 (three) days as needed., Disp: 2 tablet, Rfl: 0   metroNIDAZOLE (FLAGYL) 500 MG tablet, Take 1 tablet (500 mg total) by mouth 2 (two) times daily for 7 days., Disp: 14 tablet, Rfl: 0   amphetamine-dextroamphetamine (ADDERALL) 10 MG tablet, Take 1 tablet (10 mg total) by mouth 2 (two) times daily., Disp: 60 tablet, Rfl: 0   amphetamine-dextroamphetamine (ADDERALL) 10 MG tablet, Take 1 tablet (10 mg total) by mouth 2 (two) times daily., Disp: 60 tablet, Rfl: 0   amphetamine-dextroamphetamine (ADDERALL) 10 MG tablet, Take 1 tablet (10 mg total) by mouth 2 (two) times daily., Disp: 60 tablet, Rfl: 0   etonogestrel-ethinyl estradiol (NUVARING) 0.12-0.015 MG/24HR vaginal ring, Insert vaginally and leave in place for 3 consecutive weeks, then remove for 1 week., Disp: 1 each, Rfl: 12   fluticasone (FLONASE) 50 MCG/ACT nasal spray, Place 2 sprays into both nostrils daily., Disp: 16 g, Rfl: 2   nortriptyline (PAMELOR) 25 MG capsule, Take 1 capsule (25 mg total) by mouth at bedtime. (Patient taking differently: Take 25 mg by mouth as needed.), Disp: 90 capsule, Rfl: 2   ondansetron (ZOFRAN-ODT) 8 MG disintegrating tablet, Take 1 tablet (8 mg total) by mouth every 8 (eight) hours as needed for nausea or vomiting., Disp: 10 tablet, Rfl: 0   Medications ordered in this encounter:  Meds ordered this encounter  Medications   metroNIDAZOLE (FLAGYL) 500 MG tablet    Sig:  Take 1 tablet (500 mg total) by mouth 2 (two) times daily for 7 days.    Dispense:  14 tablet    Refill:  0    Order Specific Question:   Supervising Provider    Answer:   MILLER, BRIAN [[id number]]   fluconazole (DIFLUCAN) 150 MG tablet    Sig: Take 1 tablet (150 mg total) by mouth every 3 (three) days as needed.    Dispense:  2 tablet    Refill:  0    Order Specific Question:   Supervising Provider    Answer:   Hyacinth Meeker, BRIAN [[id number]]     *If you need refills on other medications prior to your next appointment, please contact your pharmacy*  Follow-Up: Call back or seek an in-person evaluation if the symptoms worsen or if the condition fails to improve as anticipated.  Other Instructions Bacterial Vaginosis  Bacterial vaginosis is an infection that occurs when the normal balance of bacteria in the vagina changes. This change is caused by an overgrowth of certain bacteria in the vagina. Bacterial vaginosis is the most common vaginal infection among females aged 65 to 79 years. This condition increases the risk of sexually transmitted infections (STIs). Treatment can help reduce this risk. Treatment is very important for pregnant women because this condition can cause babies to be born early (prematurely) or at a low birth weight. What are the causes? This condition is caused by an increase in harmful bacteria that are normally present in small amounts in the vagina. However, the exact reason  this condition develops is not known. You cannot get bacterial vaginosis from toilet seats, bedding, swimming pools, or contact with objects around you. What increases the risk? The following factors may make you more likely to develop this condition: Having a new sexual partner or multiple sexual partners, or having unprotected sex. Douching. Having an intrauterine device (IUD). Smoking. Abusing drugs and alcohol. This may lead to riskier sexual behavior. Taking certain antibiotic medicines. Being  pregnant. What are the signs or symptoms? Some women with this condition have no symptoms. Symptoms may include: Wallace Cullens or white vaginal discharge. The discharge can be watery or foamy. A fish-like odor with discharge, especially after sex or during menstruation. Itching in and around the vagina. Burning or pain with urination. How is this diagnosed? This condition is diagnosed based on: Your medical history. A physical exam of the vagina. Checking a sample of vaginal fluid for harmful bacteria or abnormal cells. How is this treated? This condition is treated with antibiotic medicines. These may be given as a pill, a vaginal cream, or a medicine that is put into the vagina (suppository). If the condition comes back after treatment, a second round of antibiotics may be needed. Follow these instructions at home: Medicines Take or apply over-the-counter and prescription medicines only as told by your health care provider. Take or apply your antibiotic medicine as told by your health care provider. Do not stop using the antibiotic even if you start to feel better. General instructions If you have a female sexual partner, tell her that you have a vaginal infection. She should follow up with her health care provider. If you have a female sexual partner, he does not need treatment. Avoid sexual activity until you finish treatment. Drink enough fluid to keep your urine pale yellow. Keep the area around your vagina and rectum clean. Wash the area daily with warm water. Wipe yourself from front to back after using the toilet. If you are breastfeeding, talk to your health care provider about continuing breastfeeding during treatment. Keep all follow-up visits. This is important. How is this prevented? Self-care Do not douche. Wash the outside of your vagina with warm water only. Wear cotton or cotton-lined underwear. Avoid wearing tight pants and pantyhose, especially during the summer. Safe  sex Use protection when having sex. This includes: Using condoms. Using dental dams. This is a thin layer of a material made of latex or polyurethane that protects the mouth during oral sex. Limit the number of sexual partners. To help prevent bacterial vaginosis, it is best to have sex with just one partner (monogamous relationship). Make sure you and your sexual partner are tested for STIs. Drugs and alcohol Do not use any products that contain nicotine or tobacco. These products include cigarettes, chewing tobacco, and vaping devices, such as e-cigarettes. If you need help quitting, ask your health care provider. Do not use drugs. Do not drink alcohol if: Your health care provider tells you not to do this. You are pregnant, may be pregnant, or are planning to become pregnant. If you drink alcohol: Limit how much you have to 0-1 drink a day. Be aware of how much alcohol is in your drink. In the [place] one drink equals one 12 oz bottle of beer (355 mL), one 5 oz glass of wine (148 mL), or one 1 oz glass of hard liquor (44 mL). Where to find more information Centers for Disease Control and Prevention: FootballExhibition.com.br American Sexual Health Association (ASHA): www.ashastd.org U.S. Department of Health  and Health and safety inspector, Office on [employer] Health: http://hoffman.com/ Contact a health care provider if: Your symptoms do not improve, even after treatment. You have more discharge or pain when urinating. You have a fever or chills. You have pain in your abdomen or pelvis. You have pain during sex. You have vaginal bleeding between menstrual periods. Summary Bacterial vaginosis is a vaginal infection that occurs when the normal balance of bacteria in the vagina changes. It results from an overgrowth of certain bacteria. This condition increases the risk of sexually transmitted infections (STIs). Getting treated can help reduce this risk. Treatment is very important for pregnant women because this  condition can cause babies to be born early (prematurely) or at low birth weight. This condition is treated with antibiotic medicines. These may be given as a pill, a vaginal cream, or a medicine that is put into the vagina (suppository). This information is not intended to replace advice given to you by your health care provider. Make sure you discuss any questions you have with your health care provider. Document Revised: 01/18/2020 Document Reviewed: 01/18/2020 Elsevier Patient Education  2023 Elsevier Inc.    If you have been instructed to have an in-person evaluation today at a local Urgent Care facility, please use the link below. It will take you to a list of all of our available [facility], including address, phone number and hours of operation. Please do not delay care.  [facility]  If you or a family member do not have a primary care provider, use the link below to schedule a visit and establish care. When you choose a [facility] primary care physician or advanced practice provider, you gain a long-term partner in health. Find a Primary Care Provider  Learn more about [facility]'s in-office and virtual care options: [facility] - Get Care Now

## 2022-02-02 NOTE — Telephone Encounter (Signed)
Last RF--10/17/2021--#30 Last OV--08/18/2021 NO UDS/CSC

## 2022-02-02 NOTE — Telephone Encounter (Signed)
30 d sent, due for appt. Plz sched. Ty.

## 2022-02-02 NOTE — Telephone Encounter (Signed)
Duplicate request

## 2022-02-02 NOTE — Progress Notes (Signed)
Virtual Visit Consent   Crystal Bradley, you are scheduled for a virtual visit with a [facility] provider today. Just as with appointments in the office, your consent must be obtained to participate. Your consent will be active for this visit and any virtual visit you may have with one of our providers in the next 365 days. If you have a MyChart account, a copy of this consent can be sent to you electronically.  As this is a virtual visit, video technology does not allow for your provider to perform a traditional examination. This may limit your provider's ability to fully assess your condition. If your provider identifies any concerns that need to be evaluated in person or the need to arrange testing (such as labs, EKG, etc.), we will make arrangements to do so. Although advances in technology are sophisticated, we cannot ensure that it will always work on either your end or our end. If the connection with a video visit is poor, the visit may have to be switched to a telephone visit. With either a video or telephone visit, we are not always able to ensure that we have a secure connection.  By engaging in this virtual visit, you consent to the provision of healthcare and authorize for your insurance to be billed (if applicable) for the services provided during this visit. Depending on your insurance coverage, you may receive a charge related to this service.  I need to obtain your verbal consent now. Are you willing to proceed with your visit today? Crystal Bradley has provided verbal consent on 02/02/2022 for a virtual visit (video or telephone). Margaretann Loveless, PA-C  Date: 02/02/2022 1:24 PM  Virtual Visit via Video Note   IMargaretann Loveless, connected with  Crystal Bradley  ([id number], 27-Jun-1986) on 02/02/22 at  1:15 PM EDT by a video-enabled telemedicine application and verified that I am speaking with the correct person using two identifiers.  Location: Patient: Virtual Visit Location  Patient: Home Provider: Virtual Visit Location Provider: Home Office   I discussed the limitations of evaluation and management by telemedicine and the availability of in person appointments. The patient expressed understanding and agreed to proceed.    History of Present Illness: Crystal Bradley is a 36 y.o. who identifies as a female who was assigned female at birth, and is being seen today for vaginal discharge.  HPI: Vaginal Discharge The patient's primary symptoms include a genital odor and vaginal discharge. The patient's pertinent negatives include no genital itching, genital lesions, missed menses or pelvic pain. This is a recurrent problem. The current episode started in the past 7 days. The problem occurs constantly. The problem has been gradually worsening. The patient is experiencing no pain. Pertinent negatives include no back pain, chills, fever, nausea or vomiting. The vaginal discharge was white, thin and malodorous.   Recurrent issue happens after menstrual cycles. Is on vaginal probiotics and they have helped but still gets BV occasionally.   Problems:  Patient Active Problem List   Diagnosis Date Noted   Migraine 09/19/2021   Hiatal hernia 09/19/2021   Anxiety 09/19/2021   Anemia 09/19/2021   Snoring 02/10/2021   Depression, major, single episode, moderate (HCC) 12/17/2020   Chronic intractable headache 12/17/2020   Severe major depression with psychotic features, mood-congruent (HCC) 09/13/2018   Vitamin D deficiency 11/03/2017   GAD (generalized anxiety disorder) 08/27/2017    Allergies:  Allergies  Allergen Reactions   Latex Swelling   Shellfish-Derived Products Swelling   Ativan [  Lorazepam]     Adverse reactions/ agitation   Hydroxyzine     Adverse reactions/agitation   Latex    Medications:  Current Outpatient Medications:    fluconazole (DIFLUCAN) 150 MG tablet, Take 1 tablet (150 mg total) by mouth every 3 (three) days as needed., Disp: 2 tablet, Rfl:  0   metroNIDAZOLE (FLAGYL) 500 MG tablet, Take 1 tablet (500 mg total) by mouth 2 (two) times daily for 7 days., Disp: 14 tablet, Rfl: 0   amphetamine-dextroamphetamine (ADDERALL) 10 MG tablet, Take 1 tablet (10 mg total) by mouth 2 (two) times daily., Disp: 60 tablet, Rfl: 0   amphetamine-dextroamphetamine (ADDERALL) 10 MG tablet, Take 1 tablet (10 mg total) by mouth 2 (two) times daily., Disp: 60 tablet, Rfl: 0   amphetamine-dextroamphetamine (ADDERALL) 10 MG tablet, Take 1 tablet (10 mg total) by mouth 2 (two) times daily., Disp: 60 tablet, Rfl: 0   etonogestrel-ethinyl estradiol (NUVARING) 0.12-0.015 MG/24HR vaginal ring, Insert vaginally and leave in place for 3 consecutive weeks, then remove for 1 week., Disp: 1 each, Rfl: 12   fluticasone (FLONASE) 50 MCG/ACT nasal spray, Place 2 sprays into both nostrils daily., Disp: 16 g, Rfl: 2   nortriptyline (PAMELOR) 25 MG capsule, Take 1 capsule (25 mg total) by mouth at bedtime. (Patient taking differently: Take 25 mg by mouth as needed.), Disp: 90 capsule, Rfl: 2   ondansetron (ZOFRAN-ODT) 8 MG disintegrating tablet, Take 1 tablet (8 mg total) by mouth every 8 (eight) hours as needed for nausea or vomiting., Disp: 10 tablet, Rfl: 0  Observations/Objective: Patient is well-developed, well-nourished in no acute distress.  Resting comfortably at home.  Head is normocephalic, atraumatic.  No labored breathing.  Speech is clear and coherent with logical content.  Patient is alert and oriented at baseline.    Assessment and Plan: 1. BV (bacterial vaginosis) - metroNIDAZOLE (FLAGYL) 500 MG tablet; Take 1 tablet (500 mg total) by mouth 2 (two) times daily for 7 days.  Dispense: 14 tablet; Refill: 0  2. Antibiotic-induced yeast infection - fluconazole (DIFLUCAN) 150 MG tablet; Take 1 tablet (150 mg total) by mouth every 3 (three) days as needed.  Dispense: 2 tablet; Refill: 0  - Metronidazole prescribed - Diflucan given as prophylaxis as patient  tends to get vaginal yeast infections with antibiotic use. - Continue vaginal probiotics - Seek in person evaluation if worsening  Follow Up Instructions: I discussed the assessment and treatment plan with the patient. The patient was provided an opportunity to ask questions and all were answered. The patient agreed with the plan and demonstrated an understanding of the instructions.  A copy of instructions were sent to the patient via MyChart unless otherwise noted below.    The patient was advised to call back or seek an in-person evaluation if the symptoms worsen or if the condition fails to improve as anticipated.  Time:  I spent 10 minutes with the patient via telehealth technology discussing the above problems/concerns.    Margaretann Loveless, PA-C

## 2022-02-02 NOTE — Telephone Encounter (Signed)
Patient is scheduled on 02/18/22.

## 2022-02-17 ENCOUNTER — Encounter: Payer: Self-pay | Admitting: Family Medicine

## 2022-02-17 ENCOUNTER — Telehealth: Payer: Self-pay | Admitting: Family Medicine

## 2022-02-17 ENCOUNTER — Ambulatory Visit (INDEPENDENT_AMBULATORY_CARE_PROVIDER_SITE_OTHER): Payer: Medicaid Other | Admitting: Family Medicine

## 2022-02-17 VITALS — BP 108/68 | HR 77 | Temp 98.5°F | Ht 69.0 in | Wt 215.1 lb

## 2022-02-17 DIAGNOSIS — R41840 Attention and concentration deficit: Secondary | ICD-10-CM

## 2022-02-17 DIAGNOSIS — F411 Generalized anxiety disorder: Secondary | ICD-10-CM

## 2022-02-17 DIAGNOSIS — F323 Major depressive disorder, single episode, severe with psychotic features: Secondary | ICD-10-CM

## 2022-02-17 MED ORDER — ARIPIPRAZOLE 2 MG PO TABS: 2.0000 mg | ORAL_TABLET | Freq: Every day | ORAL | 2 refills | Status: DC

## 2022-02-17 NOTE — Patient Instructions (Addendum)
Keep the diet clean and stay active.  Don't get pregnant on this medication. .  Aim to do some physical exertion for 150 minutes per week. This is typically divided into 5 days per week, 30 minutes per day. The activity should be enough to get your heart rate up. Anything is better than nothing if you have time constraints.  [facility] [address] [place], [place] [address] [phone]  [facility] [address] [place], [place] [address] [phone]  [facility] [address] [place], [place] [address] [phone]  [facility] [address], [place], [place], [phone] [address], [place], [place], #[phone]  [facility] [address] [place], [place] [address] [phone]  [facility] [address] [place], [place] [phone]  [facility] [address] [place], [place] [phone]  [facility] [address] [place], [place]   [phone].   Please go online to complete a form to submit first.  [facility]  [address] [place], [place] [phone].     [facility] -  local practices located at: [address] [place], [place]. [phone]. [address] [place], [place], [place].  [phone]. [address], [address], [place], [place].  [phone].  Contact one of these offices sooner than later as it can take 2-3 months to get a new patient appointment.

## 2022-02-17 NOTE — Progress Notes (Signed)
Chief Complaint  Patient presents with   Follow-up    Crystal Bradley is 36 y.o. female here for ADHD follow up.  Patient is currently on Adderall 10 mg bid prn and compliance is excellent. Symptoms include: None on medication. Side effects include: none. Patient believes their dose should be unchanged. Denies tics, weight loss, difficulties with sleep, self-medication, alcohol/drug abuse, chest pain, or palpitations.  Patient has a history of anxiety and depression.  She has worked with several psychiatrist through the [place] without significant relief.  She did well with Effexor but had to stop due to sexual dysfunction.  She failed several SSRIs in addition to Cymbalta as well.  She did not do well with Seroquel.  She has never been on Abilify.  She is not currently following with a psychologist.  Past Medical History:  Diagnosis Date   Anemia    Anxiety    Hiatal hernia    Migraine     BP 108/68   Pulse 77   Temp 98.5 F (36.9 C) (Oral)   Ht 5\' 9"  (1.753 m)   Wt 215 lb 2 oz (97.6 kg)   SpO2 98%   BMI 31.77 kg/m  Gen- awake, alert, appearing stated age Lungs- no accessory muscle use Neuro- no facial tics Psych- age appropriate judgment and insight, normal mood and affect  Inattention  GAD (generalized anxiety disorder) - Plan: ARIPiprazole (ABILIFY) 2 MG tablet  Severe major depression with psychotic features, mood-congruent (HCC) - Plan: ARIPiprazole (ABILIFY) 2 MG tablet  Chronic, stable.  Continue Adderall 10 mg twice daily as needed. 2/3.  Chronic, uncontrolled.  Continue working to try to find a at the Therapist, sports.  I provided stability and resources as well.  Start Abilify 2 mg daily.  Follow-up in 1 month to recheck this. Pt voiced understanding and agreement to the plan.  [place], DO 02/17/22 10:38 AM

## 2022-02-17 NOTE — Telephone Encounter (Signed)
Spoke with [place] at [facility] tracks.  Prior auth approved til 02/12/23.  [id number] Call ref # [id number]  Also called pharmacy and it went through.

## 2022-02-17 NOTE — Telephone Encounter (Signed)
Patient called and informed of approval and can pickup at the pharmacy

## 2022-03-01 IMAGING — MR MR HEAD W/O CM
8 series · 48 of 48 positions shown · non-contrast
Comparison: 07/08/2019

CLINICAL DATA: Chronic headache with increased frequency

EXAM:
MRI HEAD WITHOUT CONTRAST
TECHNIQUE: Multiplanar, multiecho pulse sequences of the brain and surrounding
structures were obtained without intravenous contrast.

[Series 2: T1 · sagittal · 5.0mm · 0.90mm/px · 3 of 27 slices shown]
[im 1/27]
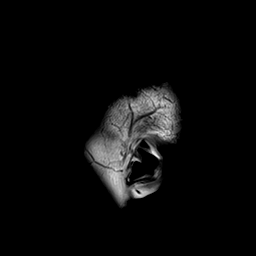
[im 14/27]
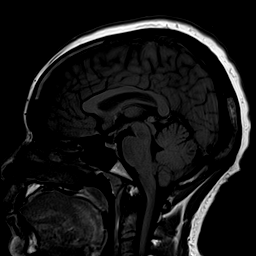
[im 27/27]
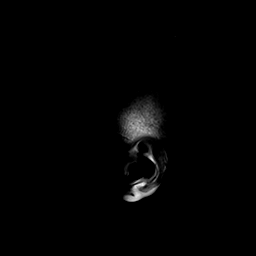

[Series 3: DWI · axial · 3.0mm · 1.88mm/px · z∈[-89,+61]mm · 12 of 96 slices shown (1 of 2)]
[im 1/96]
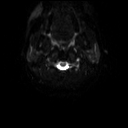
[im 9/96]
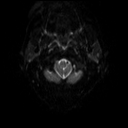
[im 18/96]
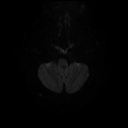
[im 26/96]
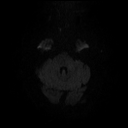
[im 35/96]
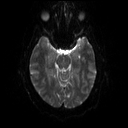
[im 44/96]
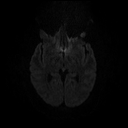
[im 52/96]
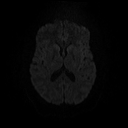
[im 61/96]
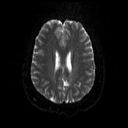
[im 70/96]
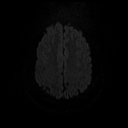
[im 78/96]
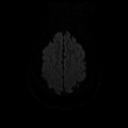
[im 87/96]
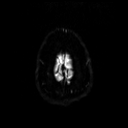
[im 96/96]
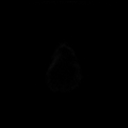

[Series 4: DWI · axial · 3.0mm · 1.88mm/px · z∈[-89,+61]mm · 6 of 48 slices shown (2 of 2)]
[im 1/48]
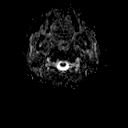
[im 10/48]
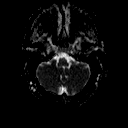
[im 19/48]
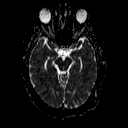
[im 29/48]
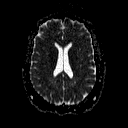
[im 38/48]
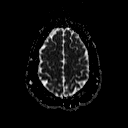
[im 48/48]
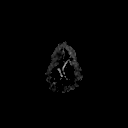

[Series 5: T2 · axial · 5.0mm · 0.69mm/px · z∈[-92,+65]mm · 3 of 28 slices shown (1 of 3)]
[im 1/28]
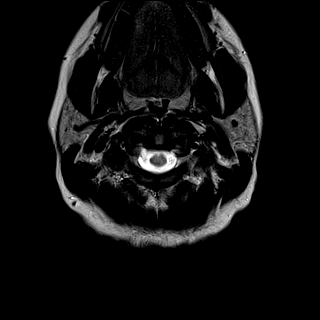
[im 14/28]
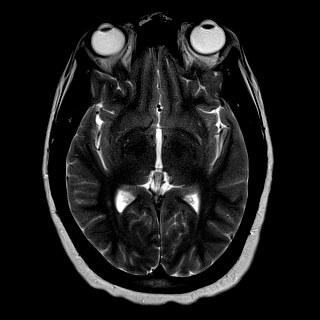
[im 28/28]
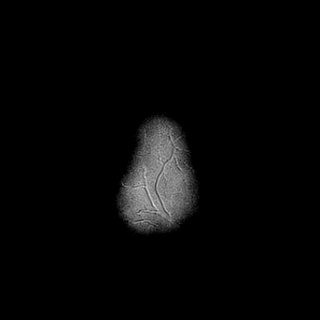

[Series 6: T2 · axial · 5.0mm · 0.43mm/px · z∈[-89,+68]mm · 3 of 28 slices shown (2 of 3)]
[im 1/28]
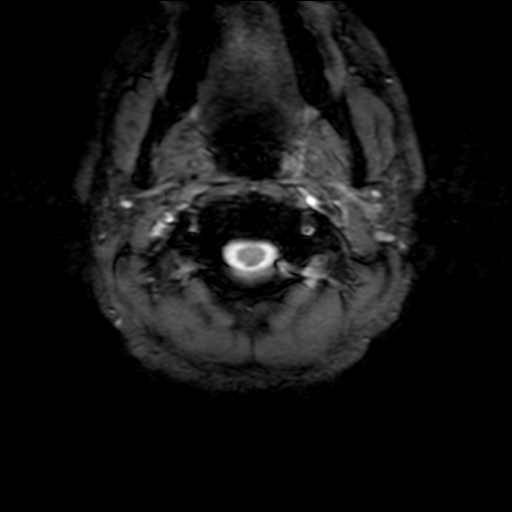
[im 14/28]
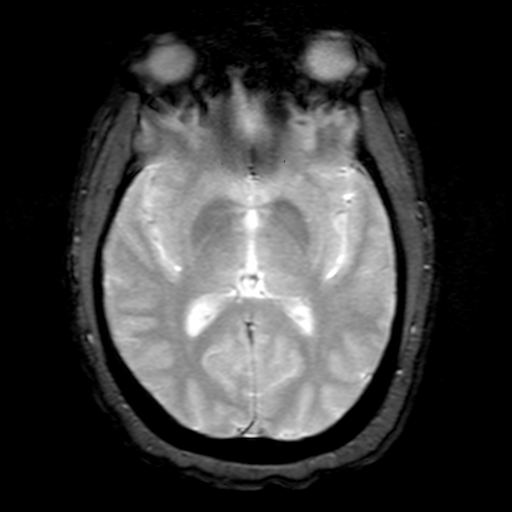
[im 28/28]
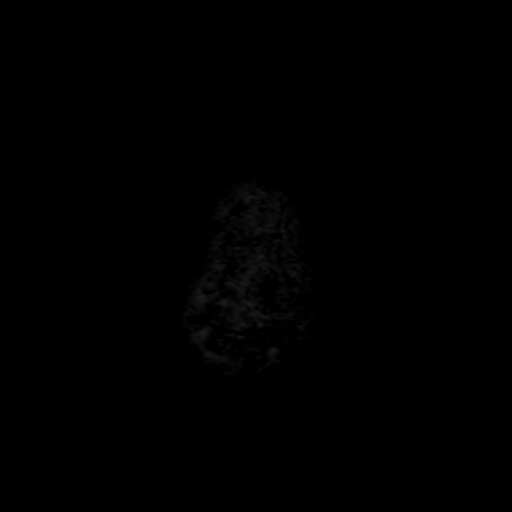

[Series 7: FLAIR · axial · 3.0mm · 0.43mm/px · z∈[-90,+68]mm · 5 of 42 slices shown]
[im 1/42]
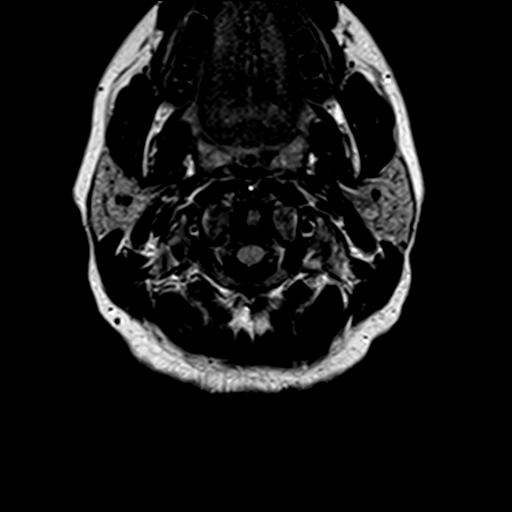
[im 11/42]
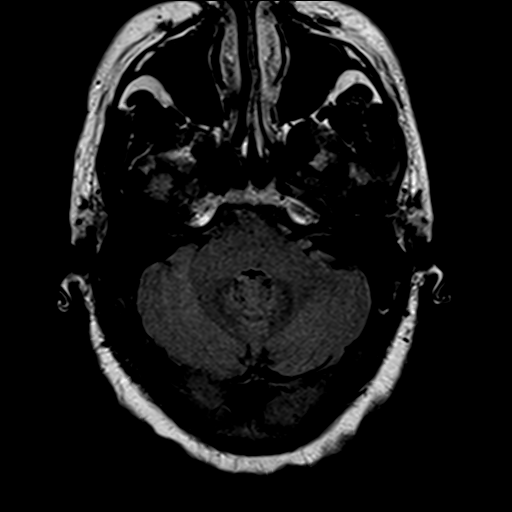
[im 21/42]
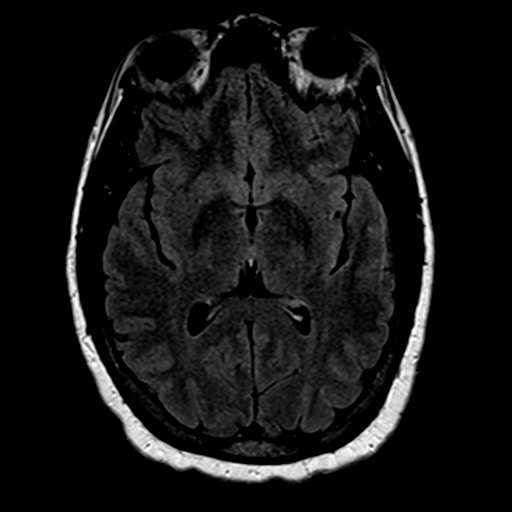
[im 31/42]
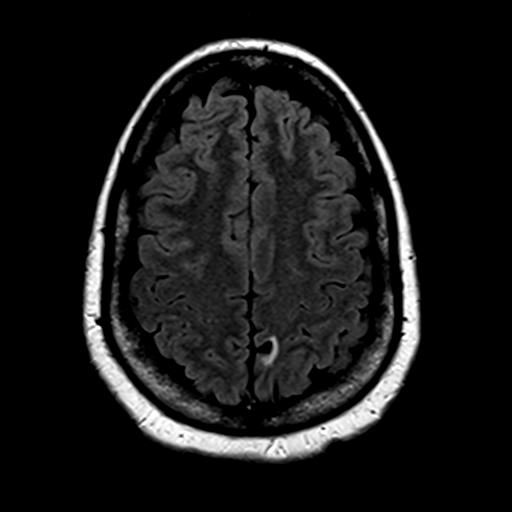
[im 42/42]
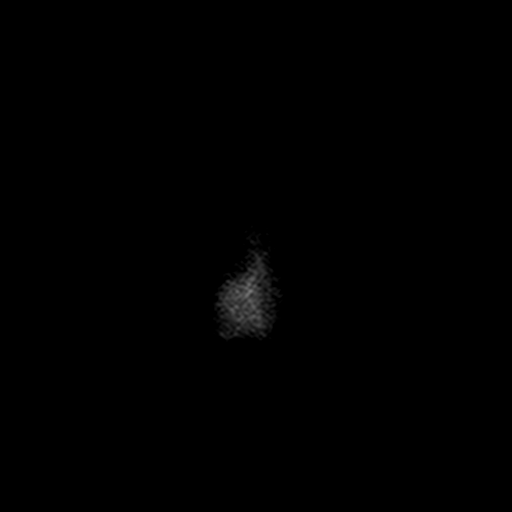

[Series 8: t1_3d_tra · axial · 2.0mm · 0.98mm/px · z∈[-102,+84]mm · 12 of 96 slices shown]
[im 1/96]
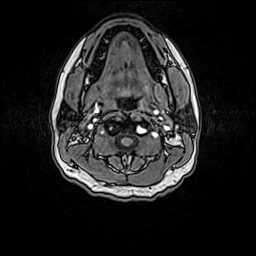
[im 9/96]
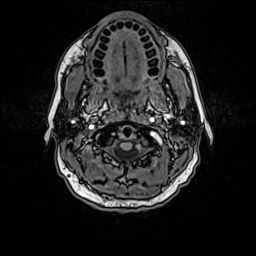
[im 18/96]
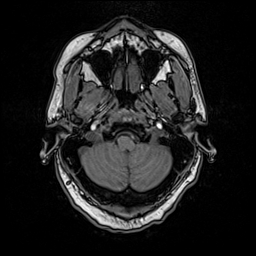
[im 26/96]
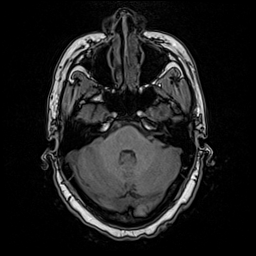
[im 35/96]
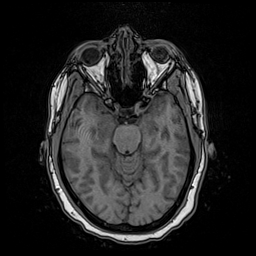
[im 44/96]
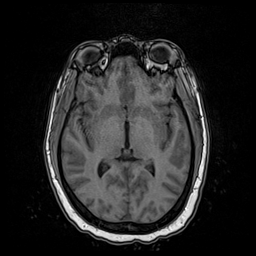
[im 52/96]
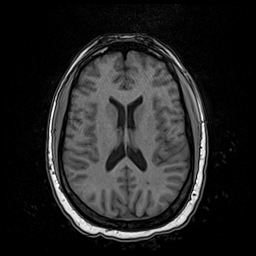
[im 61/96]
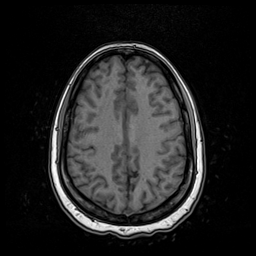
[im 70/96]
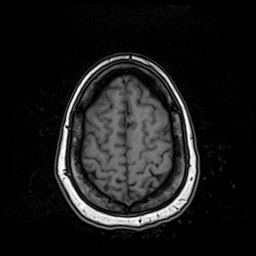
[im 78/96]
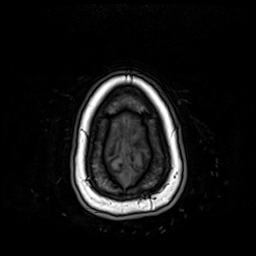
[im 87/96]
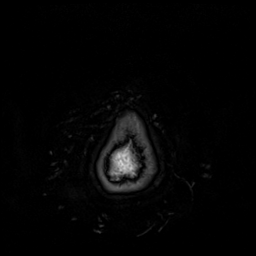
[im 96/96]
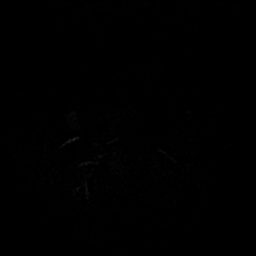

[Series 9: T2 · coronal · 5.0mm · 0.69mm/px · 4 of 32 slices shown (3 of 3)]
[im 1/32]
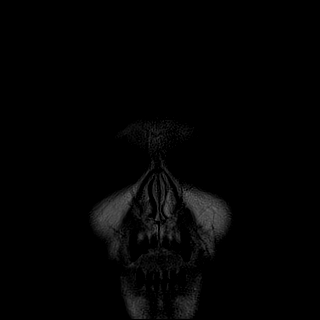
[im 11/32]
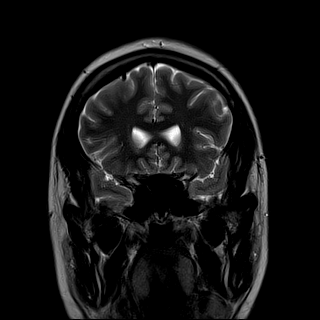
[im 21/32]
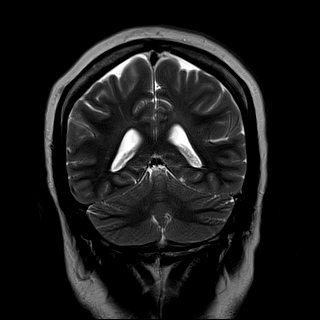
[im 32/32]
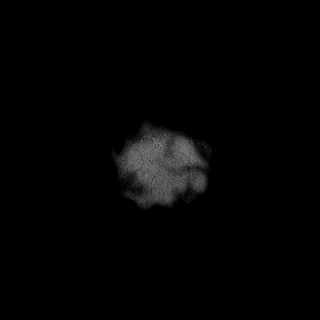

[48 of 48 positions shown; findings below may reference images not displayed]

FINDINGS: Brain: Remote left parietal parasagittal cortex infarct with stable
appearance since 4343. No acute or interval infarct. No white matter
disease, atrophy, or chronic blood products. Negative for mass or
collection.

Vascular: Normal flow voids

Skull and upper cervical spine: Normal marrow signal

Sinuses/Orbits: Negative.  No sinusitis.
IMPRESSION: Small remote left parietal infarct which is stable from 4343. No
interval finding or explanation for headache.

## 2022-03-18 ENCOUNTER — Ambulatory Visit (INDEPENDENT_AMBULATORY_CARE_PROVIDER_SITE_OTHER): Payer: Medicaid Other | Admitting: Family Medicine

## 2022-03-18 ENCOUNTER — Encounter: Payer: Self-pay | Admitting: Family Medicine

## 2022-03-18 VITALS — BP 110/72 | HR 95 | Temp 98.3°F | Ht 69.0 in | Wt 211.4 lb

## 2022-03-18 DIAGNOSIS — F323 Major depressive disorder, single episode, severe with psychotic features: Secondary | ICD-10-CM | POA: Diagnosis not present

## 2022-03-18 DIAGNOSIS — Z3202 Encounter for pregnancy test, result negative: Secondary | ICD-10-CM | POA: Diagnosis not present

## 2022-03-18 DIAGNOSIS — F411 Generalized anxiety disorder: Secondary | ICD-10-CM | POA: Diagnosis not present

## 2022-03-18 DIAGNOSIS — Z3009 Encounter for other general counseling and advice on contraception: Secondary | ICD-10-CM | POA: Diagnosis not present

## 2022-03-18 DIAGNOSIS — Z30013 Encounter for initial prescription of injectable contraceptive: Secondary | ICD-10-CM

## 2022-03-18 DIAGNOSIS — F333 Major depressive disorder, recurrent, severe with psychotic symptoms: Secondary | ICD-10-CM

## 2022-03-18 LAB — POCT PREGNANCY, URINE

## 2022-03-18 MED ORDER — ARIPIPRAZOLE 2 MG PO TABS: 2.0000 mg | ORAL_TABLET | Freq: Every day | ORAL | 2 refills | Status: DC

## 2022-03-18 MED ORDER — MEDROXYPROGESTERONE ACETATE 150 MG/ML IM SUSP
150.0000 mg | Freq: Once | INTRAMUSCULAR | Status: AC
  Administered 2022-03-18: 150 mg via INTRAMUSCULAR

## 2022-03-18 NOTE — Patient Instructions (Signed)
Please start the Abilify.   Aim to do some physical exertion for 150 minutes per week. This is typically divided into 5 days per week, 30 minutes per day. The activity should be enough to get your heart rate up. Anything is better than nothing if you have time constraints.  Let us know if you need anything.

## 2022-03-18 NOTE — Progress Notes (Signed)
Chief Complaint  Patient presents with   Follow-up    Subjective: Patient is a 36 y.o. female here for follow-up.  History of anxiety/depression and possible mania.  She was never officially diagnosed with bipolar disorder.  She was started on Abilify 2 mg daily but never picked up this prescription.  She has not been taking her Adderall either.  She does have some behavioral health treatment through the [place] but that has been a frustrating experience for her.  No homicidal or suicidal ideation.  No self-medication.  Insurance did not cover NuvaRing.  She is interested in the Depo injection at this point.  She is not pregnant and currently on her cycle.  Past Medical History:  Diagnosis Date   Anemia    Anxiety    Hiatal hernia    Migraine     Objective: BP 110/72   Pulse 95   Temp 98.3 F (36.8 C) (Oral)   Ht 5\' 9"  (1.753 m)   Wt 211 lb 6 oz (95.9 kg)   SpO2 98%   BMI 31.21 kg/m  General: Awake, appears stated age Heart: RRR, no LE edema Lungs: CTAB, no rales, wheezes or rhonchi. No accessory muscle use Psych: Age appropriate judgment and insight, normal affect and mood  Assessment and Plan: GAD (generalized anxiety disorder) - Plan: ARIPiprazole (ABILIFY) 2 MG tablet  Severe major depression with psychotic features, mood-congruent (HCC) - Plan: ARIPiprazole (ABILIFY) 2 MG tablet  Birth control counseling - Plan: POCT Pregnancy, Urine, medroxyPROGESTERone (DEPO-PROVERA) injection 150 mg  1/2.  Chronic, not controlled.  She needs to start Abilify 2 mg daily.  Follow-up in around 5 weeks.  Counseled on exercise. 3.  Pregnancy test negative today.  We will start quarterly Depo-Provera injections. The patient voiced understanding and agreement to the plan.  [place], DO 03/18/22  11:58 AM

## 2022-03-20 ENCOUNTER — Ambulatory Visit: Payer: Medicaid Other | Admitting: Family Medicine

## 2022-04-08 ENCOUNTER — Telehealth: Payer: Medicaid Other | Admitting: Physician Assistant

## 2022-04-08 DIAGNOSIS — Z20822 Contact with and (suspected) exposure to covid-19: Secondary | ICD-10-CM | POA: Diagnosis not present

## 2022-04-08 MED ORDER — ALBUTEROL SULFATE HFA 108 (90 BASE) MCG/ACT IN AERS: 1.0000 | INHALATION_SPRAY | Freq: Four times a day (QID) | RESPIRATORY_TRACT | 0 refills | Status: DC | PRN

## 2022-04-08 MED ORDER — PSEUDOEPH-BROMPHEN-DM 30-2-10 MG/5ML PO SYRP: 5.0000 mL | ORAL_SOLUTION | Freq: Four times a day (QID) | ORAL | 0 refills | Status: DC | PRN

## 2022-04-08 MED ORDER — BENZONATATE 100 MG PO CAPS: 100.0000 mg | ORAL_CAPSULE | Freq: Three times a day (TID) | ORAL | 0 refills | Status: DC | PRN

## 2022-04-08 NOTE — Progress Notes (Signed)
E-Visit for Corona Virus Screening  Your current symptoms could be consistent with the coronavirus.  Many health care providers can now test patients at their office but not all are.  [facility] has multiple testing sites. For information on our COVID testing locations and hours go to [email or web address]  We are enrolling you in our MyChart Home Monitoring for COVID19 . Daily you will receive a questionnaire within the MyChart website. Our COVID 19 response team will be monitoring your responses daily.  Testing Information: The COVID-19 Community Testing sites are testing BY APPOINTMENT ONLY.  You can schedule online at [email or web address]  If you do not have access to a smart phone or computer you may call [phone] for an appointment.   Additional testing sites in the Community:  For [facility] Testing sites in [place]  [email or web address]  For Pop-up testing sites in [place]  [email or web address]  For [facility] [email or web address]  For [facility] testing in [place] and [employer] [email or web address]  For [facility] testing in [place]   [email or web address]  For  more information about community testing call [phone]   Please quarantine yourself while awaiting your test results. Please stay home for a minimum of 10 days from the first day of illness with improving symptoms and you have had 24 hours of no fever (without the use of Tylenol (Acetaminophen) Motrin (Ibuprofen) or any fever reducing medication).  Also - Do not get tested prior to returning to work because once you have had a positive test the test can stay positive for  more than a month in some cases.   You should wear a mask or cloth face covering over your nose and mouth if you must be around other people or animals, including pets (even at home). Try to stay at least 6 feet away from other people. This will protect the people around you.  Please continue good preventive care measures, including:  frequent hand-washing, avoid touching your face, cover coughs/sneezes, stay out of crowds and keep a 6 foot distance from others.  COVID-19 is a respiratory illness with symptoms that are similar to the flu. Symptoms are typically mild to moderate, but there have been cases of severe illness and death due to the virus.   The following symptoms may appear 2-14 days after exposure: Fever Cough Shortness of breath or difficulty breathing Chills Repeated shaking with chills Muscle pain Headache Sore throat New loss of taste or smell Fatigue Congestion or runny nose Nausea or vomiting Diarrhea  Go to the nearest hospital ED for assessment if fever/cough/breathlessness are severe or illness seems like a threat to life.  It is vitally important that if you feel that you have an infection such as this virus or any other virus that you stay home and away from places where you may spread it to others.  You should avoid contact with people age 38 and older.   You can use medication such as prescription cough medication called Tessalon Perles 100 mg. You may take 1-2 capsules every 8 hours as needed for cough,  prescription inhaler called Albuterol MDI 90 mcg /actuation 2 puffs every 4 hours as needed for shortness of breath, wheezing, cough, and prescription cough medication called Phenergan DM 6.25 mg/15 mg. You make take one teaspoon / 5 ml every 4-6 hours as needed for cough  You may also take acetaminophen (Tylenol) as needed for fever.  Reduce your risk of any infection by using the same precautions used for avoiding  the common cold or flu:  Wash your hands often  with soap and warm water for at least 20 seconds.  If soap and water are not readily available, use an alcohol-based hand sanitizer with at least 60% alcohol.  If coughing or sneezing, cover your mouth and nose by coughing or sneezing into the elbow areas of your shirt or coat, into a tissue or into your sleeve (not your hands). Avoid shaking hands with others and consider head nods or verbal greetings only. Avoid touching your eyes, nose, or mouth with unwashed hands.  Avoid close contact with people who are sick. Avoid places or events with large numbers of people in one location, like concerts or sporting events. Carefully consider travel plans you have or are making. If you are planning any travel outside or inside the [place], visit the CDC's Travelers' Health webpage for the latest health notices. If you have some symptoms but not all symptoms, continue to monitor at home and seek medical attention if your symptoms worsen. If you are having a medical emergency, call 911.  HOME CARE Only take medications as instructed by your medical team. Drink plenty of fluids and get plenty of rest. A steam or ultrasonic humidifier can help if you have congestion.   GET HELP RIGHT AWAY IF YOU HAVE EMERGENCY WARNING SIGNS** FOR COVID-19. If you or someone is showing any of these signs seek emergency medical care immediately. Call 911 or proceed to your closest emergency facility if: You develop worsening high fever. Trouble breathing Bluish lips or face Persistent pain or pressure in the chest New confusion Inability to wake or stay awake You cough up blood. Your symptoms become more severe  **This list is not all possible symptoms. Contact your medical provider for any symptoms that are sever or concerning to you.  MAKE SURE YOU  Understand these instructions. Will watch your condition. Will get help right away if you are not doing well or get worse.  Your e-visit answers were reviewed by a board  certified advanced clinical practitioner to complete your personal care plan.  Depending on the condition, your plan could have included both over the counter or prescription medications.  If there is a problem please reply once you have received a response from your provider.  Your safety is important to [place].  If you have drug allergies check your prescription carefully.    You can use MyChart to ask questions about today's visit, request a non-urgent call back, or ask for a work or school excuse for 24 hours related to this e-Visit. If it has been greater than 24 hours you will need to follow up with your provider, or enter a new e-Visit to address those concerns. You will get an e-mail in the next two days asking about your experience.  I hope that your e-visit has been valuable and will speed your recovery. Thank you for using e-visits.  I provided 5 minutes of non face-to-face time during this encounter for chart review and documentation.

## 2022-04-22 ENCOUNTER — Ambulatory Visit: Payer: Medicaid Other | Admitting: Family Medicine

## 2022-05-11 ENCOUNTER — Telehealth: Payer: Medicaid Other | Admitting: Emergency Medicine

## 2022-05-11 DIAGNOSIS — B9689 Other specified bacterial agents as the cause of diseases classified elsewhere: Secondary | ICD-10-CM | POA: Diagnosis not present

## 2022-05-11 DIAGNOSIS — N760 Acute vaginitis: Secondary | ICD-10-CM | POA: Diagnosis not present

## 2022-05-11 MED ORDER — METRONIDAZOLE 500 MG PO TABS: 500.0000 mg | ORAL_TABLET | Freq: Two times a day (BID) | ORAL | 0 refills | Status: DC

## 2022-05-11 MED ORDER — FLUCONAZOLE 150 MG PO TABS: 150.0000 mg | ORAL_TABLET | Freq: Once | ORAL | 0 refills | Status: AC

## 2022-05-11 NOTE — Progress Notes (Signed)
Virtual Visit Consent   Crystal Bradley, you are scheduled for a virtual visit with a [facility] provider today. Just as with appointments in the office, your consent must be obtained to participate. Your consent will be active for this visit and any virtual visit you may have with one of our providers in the next 365 days. If you have a MyChart account, a copy of this consent can be sent to you electronically.  As this is a virtual visit, video technology does not allow for your provider to perform a traditional examination. This may limit your provider's ability to fully assess your condition. If your provider identifies any concerns that need to be evaluated in person or the need to arrange testing (such as labs, EKG, etc.), we will make arrangements to do so. Although advances in technology are sophisticated, we cannot ensure that it will always work on either your end or our end. If the connection with a video visit is poor, the visit may have to be switched to a telephone visit. With either a video or telephone visit, we are not always able to ensure that we have a secure connection.  By engaging in this virtual visit, you consent to the provision of healthcare and authorize for your insurance to be billed (if applicable) for the services provided during this visit. Depending on your insurance coverage, you may receive a charge related to this service.  I need to obtain your verbal consent now. Are you willing to proceed with your visit today? Crystal Bradley has provided verbal consent on 05/11/2022 for a virtual visit (video or telephone). Montine Circle, PA-C  Date: 05/11/2022 2:16 PM  Virtual Visit via Video Note   I, Montine Circle, connected with  Crystal Bradley  ([id number], Mar 31, 1986) on 05/11/22 at  2:15 PM EDT by a video-enabled telemedicine application and verified that I am speaking with the correct person using two identifiers.  Location: Patient: Virtual Visit Location Patient:  Home Provider: Virtual Visit Location Provider: Home   I discussed the limitations of evaluation and management by telemedicine and the availability of in person appointments. The patient expressed understanding and agreed to proceed.    History of Present Illness: Crystal Bradley is a 36 y.o. 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  Last treated a few months ago.  States that she normally sees the [place].  States that she has an appointment with the [place], but it got cancelled.  Denies pregnancy or breastfeeding.  States she is not concerned for STD.  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[place]) 09/13/2018   Vitamin D deficiency 11/03/2017   GAD (generalized anxiety disorder) 08/27/2017    Allergies:  Allergies  Allergen Reactions   Latex Swelling   Shellfish-Derived Products Swelling   Ativan [Lorazepam]     Adverse reactions/ agitation   Hydroxyzine     Adverse reactions/agitation   Latex    Medications:  Current Outpatient Medications:    albuterol (VENTOLIN HFA) 108 (90 Base) MCG/ACT inhaler, Inhale 1-2 puffs into the lungs every 6 (six) hours as needed  for wheezing or shortness of breath., Disp: 8 g, Rfl: 0   amphetamine-dextroamphetamine (ADDERALL) 10 MG tablet, Take 1 tablet (10 mg total) by mouth 2 (two) times daily., Disp: 60 tablet, Rfl: 0   amphetamine-dextroamphetamine (ADDERALL) 10 MG tablet, Take 1 tablet (10 mg total) by mouth 2 (two) times daily., Disp: 60 tablet, Rfl: 0   amphetamine-dextroamphetamine (ADDERALL) 10 MG tablet, Take 1 tablet (10 mg total)  by mouth 2 (two) times daily., Disp: 60 tablet, Rfl: 0   ARIPiprazole (ABILIFY) 2 MG tablet, Take 1 tablet (2 mg total) by mouth daily., Disp: 30 tablet, Rfl: 2   benzonatate (TESSALON) 100 MG capsule, Take 1 capsule (100 mg total) by mouth 3 (three) times daily as needed., Disp: 30 capsule, Rfl: 0   brompheniramine-pseudoephedrine-DM 30-2-10 MG/5ML syrup, Take 5 mLs by mouth 4 (four) times daily as needed., Disp: 120 mL, Rfl: 0  Observations/Objective: Patient is well-developed, well-nourished in no acute distress.  Resting comfortably  at home.  Head is normocephalic, atraumatic.  No labored breathing.  Speech is clear and coherent with logical content.  Patient is alert and oriented at baseline.    Assessment and Plan: 1. BV (bacterial vaginosis)  - Flagyl for BV - Diflucan for yeast prophylaxis - [facility]   Follow Up Instructions: I discussed the assessment and treatment plan with the patient. The patient was provided an opportunity to ask questions and all were answered. The patient agreed with the plan and demonstrated an understanding of the instructions.  A copy of instructions were sent to the patient via MyChart unless otherwise noted below.    The patient was advised to call back or seek an in-person evaluation if the symptoms worsen or if the condition fails to improve as anticipated.  Time:  I spent 10 minutes with the patient via telehealth technology discussing the above problems/concerns.    Roxy Horseman, PA-C

## 2022-06-02 ENCOUNTER — Encounter: Payer: Self-pay | Admitting: Family Medicine

## 2022-06-02 ENCOUNTER — Telehealth (INDEPENDENT_AMBULATORY_CARE_PROVIDER_SITE_OTHER): Payer: Medicaid Other | Admitting: Family Medicine

## 2022-06-02 DIAGNOSIS — F321 Major depressive disorder, single episode, moderate: Secondary | ICD-10-CM | POA: Diagnosis not present

## 2022-06-02 DIAGNOSIS — F4310 Post-traumatic stress disorder, unspecified: Secondary | ICD-10-CM

## 2022-06-02 DIAGNOSIS — F411 Generalized anxiety disorder: Secondary | ICD-10-CM

## 2022-06-02 DIAGNOSIS — N939 Abnormal uterine and vaginal bleeding, unspecified: Secondary | ICD-10-CM | POA: Diagnosis not present

## 2022-06-02 MED ORDER — RISPERIDONE 0.5 MG PO TABS: 0.5000 mg | ORAL_TABLET | Freq: Every day | ORAL | 1 refills | Status: DC

## 2022-06-02 MED ORDER — NORGESTIMATE-ETH ESTRADIOL 0.25-35 MG-MCG PO TABS: ORAL_TABLET | ORAL | 1 refills | Status: DC

## 2022-06-02 MED ORDER — CLONAZEPAM 0.5 MG PO TABS: 0.5000 mg | ORAL_TABLET | Freq: Two times a day (BID) | ORAL | 1 refills | Status: DC | PRN

## 2022-06-02 NOTE — Progress Notes (Signed)
Chief Complaint  Patient presents with   depo side effects    Subjective Crystal Bradley presents for f/u PTSD. Due to COVID-19 pandemic, we are interacting via web portal for an electronic face-to-face visit. I verified patient's ID using 2 identifiers. Patient agreed to proceed with visit via this method. Patient is at home, I am at office. Patient and I are present for visit.   Pt is currently being treated with Abilify 2 mg/d.  Reports no improvement since treatment. No thoughts of harming self or others. No self-medication with alcohol, prescription drugs or illicit drugs. Pt is not following with a counselor/psychologist. Has appt Friday. Trying to get set up w psych thru [place].  She has now failed Abilify and has not done well with Seroquel in the past.  She has done well with Klonopin for panic and is requesting a refill until she can get in with a psychiatrist.  Other medicine she has failed include sertraline, duloxetine, venlafaxine, nortriptyline, and trazodone.  She is also having unscheduled bleeding since starting the Depo injection for birth control.  Past Medical History:  Diagnosis Date   Anemia    Anxiety    Hiatal hernia    Migraine    Allergies as of 06/02/2022       Reactions   Latex Swelling   Shellfish-derived Products Swelling   Ativan [lorazepam]    Adverse reactions/ agitation   Hydroxyzine    Adverse reactions/agitation   Latex         Medication List        Accurate as of June 02, 2022  8:25 AM. If you have any questions, ask your nurse or doctor.          STOP taking these medications    ARIPiprazole 2 MG tablet Commonly known as: Abilify Stopped by: Shelda Pal, DO   benzonatate 100 MG capsule Commonly known as: TESSALON Stopped by: Shelda Pal, DO   brompheniramine-pseudoephedrine-DM 30-2-10 MG/5ML syrup Stopped by: Shelda Pal, DO   metroNIDAZOLE 500 MG tablet Commonly known as:  FLAGYL Stopped by: Shelda Pal, DO       TAKE these medications    albuterol 108 (90 Base) MCG/ACT inhaler Commonly known as: VENTOLIN HFA Inhale 1-2 puffs into the lungs every 6 (six) hours as needed for wheezing or shortness of breath.   amphetamine-dextroamphetamine 10 MG tablet Commonly known as: ADDERALL Take 1 tablet (10 mg total) by mouth 2 (two) times daily.   amphetamine-dextroamphetamine 10 MG tablet Commonly known as: ADDERALL Take 1 tablet (10 mg total) by mouth 2 (two) times daily.   amphetamine-dextroamphetamine 10 MG tablet Commonly known as: ADDERALL Take 1 tablet (10 mg total) by mouth 2 (two) times daily.   clonazePAM 0.5 MG tablet Commonly known as: KLONOPIN Take 1 tablet (0.5 mg total) by mouth 2 (two) times daily as needed for anxiety. Started by: Shelda Pal, DO   norgestimate-ethinyl estradiol 0.25-35 MG-MCG tablet Commonly known as: Sprintec 28 Take 1 tab daily for 10 days for breakthrough bleeding. Started by: Shelda Pal, DO   risperiDONE 0.5 MG tablet Commonly known as: RisperDAL Take 1 tablet (0.5 mg total) by mouth at bedtime. Started by: Shelda Pal, DO        Exam No conversational dyspnea Age appropriate judgment and insight Nml affect and mood  Assessment and Plan  GAD (generalized anxiety disorder) - Plan: risperiDONE (RISPERDAL) 0.5 MG tablet, clonazePAM (KLONOPIN) 0.5 MG tablet  Depression, major,  single episode, moderate (HCC) - Plan: risperiDONE (RISPERDAL) 0.5 MG tablet  PTSD (post-traumatic stress disorder) - Plan: risperiDONE (RISPERDAL) 0.5 MG tablet  Abnormal uterine bleeding (AUB) - Plan: norgestimate-ethinyl estradiol (SPRINTEC 28) 0.25-35 MG-MCG tablet  1-3.  Chronic, unstable.  Stop Abilify, restart Klonopin 0.5 mg twice daily as needed and start Risperdal 0.5 mg nightly.  She has appointment with the counseling team Friday which she is encouraged to keep.  I will send  her a list of psychiatric resources on MyChart.  She will continue to set up with a psychiatrist through the [place].  F/u in 1 mo. if she is still not having any benefit, would consider Lamictal or carbamazepine. 4.  Having unscheduled bleeding while on a progesterone, will give 10 days of estrogen to stabilize endometrium. The patient voiced understanding and agreement to the plan.  Jilda Roche [place], DO 06/02/22 8:25 AM

## 2022-12-13 ENCOUNTER — Telehealth: Payer: Medicaid Other | Admitting: Nurse Practitioner

## 2022-12-13 DIAGNOSIS — R399 Unspecified symptoms and signs involving the genitourinary system: Secondary | ICD-10-CM

## 2022-12-13 DIAGNOSIS — T3695XA Adverse effect of unspecified systemic antibiotic, initial encounter: Secondary | ICD-10-CM | POA: Diagnosis not present

## 2022-12-13 DIAGNOSIS — B379 Candidiasis, unspecified: Secondary | ICD-10-CM | POA: Diagnosis not present

## 2022-12-13 MED ORDER — FLUCONAZOLE 150 MG PO TABS
150.0000 mg | ORAL_TABLET | Freq: Once | ORAL | 0 refills | Status: AC
Start: 2022-12-13 — End: 2022-12-13

## 2022-12-13 MED ORDER — NITROFURANTOIN MONOHYD MACRO 100 MG PO CAPS
100.0000 mg | ORAL_CAPSULE | Freq: Two times a day (BID) | ORAL | 0 refills | Status: AC
Start: 2022-12-13 — End: 2022-12-18

## 2022-12-13 NOTE — Progress Notes (Signed)
Virtual Visit Consent   Crystal Bradley, you are scheduled for a virtual visit with a [facility] provider today. Just as with appointments in the office, your consent must be obtained to participate. Your consent will be active for this visit and any virtual visit you may have with one of our providers in the next 365 days. If you have a MyChart account, a copy of this consent can be sent to you electronically.  As this is a virtual visit, video technology does not allow for your provider to perform a traditional examination. This may limit your provider's ability to fully assess your condition. If your provider identifies any concerns that need to be evaluated in person or the need to arrange testing (such as labs, EKG, etc.), we will make arrangements to do so. Although advances in technology are sophisticated, we cannot ensure that it will always work on either your end or our end. If the connection with a video visit is poor, the visit may have to be switched to a telephone visit. With either a video or telephone visit, we are not always able to ensure that we have a secure connection.  By engaging in this virtual visit, you consent to the provision of healthcare and authorize for your insurance to be billed (if applicable) for the services provided during this visit. Depending on your insurance coverage, you may receive a charge related to this service.  I need to obtain your verbal consent now. Are you willing to proceed with your visit today? Lanaia Breyette has provided verbal consent on 12/13/2022 for a virtual visit (video or telephone). Claiborne Rigg, NP  Date: 12/13/2022 11:34 AM  Virtual Visit via Video Note   I, Claiborne Rigg, connected with  Crystal Bradley  ([id number], 1985-10-17) on 12/13/22 at 11:30 AM EDT by a video-enabled telemedicine application and verified that I am speaking with the correct person using two identifiers.  Location: Patient: Virtual Visit Location Patient:  Home Provider: Virtual Visit Location Provider: Home Office   I discussed the limitations of evaluation and management by telemedicine and the availability of in person appointments. The patient expressed understanding and agreed to proceed.    History of Present Illness: Crystal Bradley is a 37 y.o. who identifies as a female who was assigned female at birth, and is being seen today for UTI.  Notes 3 days onset of urgency to urinate, dysuria and feeling of incomplete bladder emptying. Taking AZO with no relief. Denies fever, flank pain or hematuria. Is not currently sexually active.   Problems:  Patient Active Problem List   Diagnosis Date Noted   Migraine 09/19/2021   Hiatal hernia 09/19/2021   Anxiety 09/19/2021   Anemia 09/19/2021   Snoring 02/10/2021   Depression, major, single episode, moderate (HCC) 12/17/2020   Chronic intractable headache 12/17/2020   Severe major depression with psychotic features, mood-congruent (HCC) 09/13/2018   Vitamin D deficiency 11/03/2017   GAD (generalized anxiety disorder) 08/27/2017    Allergies:  Allergies  Allergen Reactions   Latex Swelling   Shellfish-Derived Products Swelling   Ativan [Lorazepam]     Adverse reactions/ agitation   Hydroxyzine     Adverse reactions/agitation   Latex    Medications:  Current Outpatient Medications:    fluconazole (DIFLUCAN) 150 MG tablet, Take 1 tablet (150 mg total) by mouth once for 1 dose., Disp: 1 tablet, Rfl: 0   nitrofurantoin, macrocrystal-monohydrate, (MACROBID) 100 MG capsule, Take 1 capsule (100 mg total) by mouth 2 (  two) times daily for 5 days., Disp: 10 capsule, Rfl: 0   albuterol (VENTOLIN HFA) 108 (90 Base) MCG/ACT inhaler, Inhale 1-2 puffs into the lungs every 6 (six) hours as needed for wheezing or shortness of breath., Disp: 8 g, Rfl: 0   amphetamine-dextroamphetamine (ADDERALL) 10 MG tablet, Take 1 tablet (10 mg total) by mouth 2 (two) times daily., Disp: 60 tablet, Rfl: 0   clonazePAM  (KLONOPIN) 0.5 MG tablet, Take 1 tablet (0.5 mg total) by mouth 2 (two) times daily as needed for anxiety., Disp: 30 tablet, Rfl: 1   norgestimate-ethinyl estradiol (SPRINTEC 28) 0.25-35 MG-MCG tablet, Take 1 tab daily for 10 days for breakthrough bleeding., Disp: 28 tablet, Rfl: 1   risperiDONE (RISPERDAL) 0.5 MG tablet, Take 1 tablet (0.5 mg total) by mouth at bedtime., Disp: 30 tablet, Rfl: 1  Observations/Objective: Patient is well-developed, well-nourished in no acute distress.  Resting comfortably at home.  Head is normocephalic, atraumatic.  No labored breathing.  Speech is clear and coherent with logical content.  Patient is alert and oriented at baseline.    Assessment and Plan: 1. UTI symptoms - nitrofurantoin, macrocrystal-monohydrate, (MACROBID) 100 MG capsule; Take 1 capsule (100 mg total) by mouth 2 (two) times daily for 5 days.  Dispense: 10 capsule; Refill: 0  2. Antibiotic-induced yeast infection - fluconazole (DIFLUCAN) 150 MG tablet; Take 1 tablet (150 mg total) by mouth once for 1 dose.  Dispense: 1 tablet; Refill: 0    Follow Up Instructions: I discussed the assessment and treatment plan with the patient. The patient was provided an opportunity to ask questions and all were answered. The patient agreed with the plan and demonstrated an understanding of the instructions.  A copy of instructions were sent to the patient via MyChart unless otherwise noted below.     The patient was advised to call back or seek an in-person evaluation if the symptoms worsen or if the condition fails to improve as anticipated.  Time:  I spent 11 minutes with the patient via telehealth technology discussing the above problems/concerns.    Claiborne Rigg, NP

## 2022-12-13 NOTE — Patient Instructions (Signed)
Ivar Drape, thank you for joining Claiborne Rigg, NP for today's virtual visit.  While this provider is not your primary care provider (PCP), if your PCP is located in our provider database this encounter information will be shared with them immediately following your visit.   A [facility] MyChart account gives you access to today's visit and all your visits, tests, and labs performed at [facility] " click here if you don't have a [facility] MyChart account or go to [email or web address]  Consent: (Patient) Crystal Bradley provided verbal consent for this virtual visit at the beginning of the encounter.  Current Medications:  Current Outpatient Medications:    fluconazole (DIFLUCAN) 150 MG tablet, Take 1 tablet (150 mg total) by mouth once for 1 dose., Disp: 1 tablet, Rfl: 0   nitrofurantoin, macrocrystal-monohydrate, (MACROBID) 100 MG capsule, Take 1 capsule (100 mg total) by mouth 2 (two) times daily for 5 days., Disp: 10 capsule, Rfl: 0   albuterol (VENTOLIN HFA) 108 (90 Base) MCG/ACT inhaler, Inhale 1-2 puffs into the lungs every 6 (six) hours as needed for wheezing or shortness of breath., Disp: 8 g, Rfl: 0   amphetamine-dextroamphetamine (ADDERALL) 10 MG tablet, Take 1 tablet (10 mg total) by mouth 2 (two) times daily., Disp: 60 tablet, Rfl: 0   clonazePAM (KLONOPIN) 0.5 MG tablet, Take 1 tablet (0.5 mg total) by mouth 2 (two) times daily as needed for anxiety., Disp: 30 tablet, Rfl: 1   norgestimate-ethinyl estradiol (SPRINTEC 28) 0.25-35 MG-MCG tablet, Take 1 tab daily for 10 days for breakthrough bleeding., Disp: 28 tablet, Rfl: 1   risperiDONE (RISPERDAL) 0.5 MG tablet, Take 1 tablet (0.5 mg total) by mouth at bedtime., Disp: 30 tablet, Rfl: 1   Medications ordered in this encounter:  Meds ordered this encounter  Medications   nitrofurantoin, macrocrystal-monohydrate, (MACROBID) 100 MG capsule    Sig: Take 1 capsule (100 mg total) by mouth 2 (two) times daily  for 5 days.    Dispense:  10 capsule    Refill:  0    Order Specific Question:   Supervising Provider    Answer:   Merrilee Jansky [[id number]]   fluconazole (DIFLUCAN) 150 MG tablet    Sig: Take 1 tablet (150 mg total) by mouth once for 1 dose.    Dispense:  1 tablet    Refill:  0    Order Specific Question:   Supervising Provider    Answer:   Merrilee Jansky [id number]     *If you need refills on other medications prior to your next appointment, please contact your pharmacy*  Follow-Up: Call back or seek an in-person evaluation if the symptoms worsen or if the condition fails to improve as anticipated.  [facility] [phone]   If you have been instructed to have an in-person evaluation today at a local Urgent Care facility, please use the link below. It will take you to a list of all of our available [facility], including address, phone number and hours of operation. Please do not delay care.  [facility]  If you or a family member do not have a primary care provider, use the link below to schedule a visit and establish care. When you choose a [facility] primary care physician or advanced practice provider, you gain a long-term partner in health. Find a Primary Care Provider  Learn more about [facility] in-office and virtual care options: [facility]

## 2023-03-23 ENCOUNTER — Emergency Department (HOSPITAL_BASED_OUTPATIENT_CLINIC_OR_DEPARTMENT_OTHER)
Admission: EM | Admit: 2023-03-23 | Discharge: 2023-03-23 | Disposition: A | Discharge status: Home / Self Care | Payer: MEDICAID | Attending: Emergency Medicine | Admitting: Emergency Medicine

## 2023-03-23 ENCOUNTER — Other Ambulatory Visit: Payer: Self-pay

## 2023-03-23 ENCOUNTER — Encounter (HOSPITAL_BASED_OUTPATIENT_CLINIC_OR_DEPARTMENT_OTHER): Payer: Self-pay | Admitting: Emergency Medicine

## 2023-03-23 ENCOUNTER — Emergency Department (HOSPITAL_BASED_OUTPATIENT_CLINIC_OR_DEPARTMENT_OTHER): Payer: MEDICAID

## 2023-03-23 DIAGNOSIS — Z1152 Encounter for screening for COVID-19: Secondary | ICD-10-CM | POA: Diagnosis not present

## 2023-03-23 DIAGNOSIS — R0602 Shortness of breath: Secondary | ICD-10-CM | POA: Diagnosis present

## 2023-03-23 DIAGNOSIS — R519 Headache, unspecified: Secondary | ICD-10-CM | POA: Diagnosis not present

## 2023-03-23 DIAGNOSIS — Z91040 Latex allergy status: Secondary | ICD-10-CM | POA: Insufficient documentation

## 2023-03-23 DIAGNOSIS — R791 Abnormal coagulation profile: Secondary | ICD-10-CM | POA: Insufficient documentation

## 2023-03-23 DIAGNOSIS — R0600 Dyspnea, unspecified: Secondary | ICD-10-CM | POA: Insufficient documentation

## 2023-03-23 DIAGNOSIS — F419 Anxiety disorder, unspecified: Secondary | ICD-10-CM | POA: Diagnosis not present

## 2023-03-23 LAB — BASIC METABOLIC PANEL
Anion gap: 10 (ref 5–15)
BUN: 18 mg/dL (ref 6–20)
CO2: 22 mmol/L (ref 22–32)
Calcium: 9.1 mg/dL (ref 8.9–10.3)
Chloride: 105 mmol/L (ref 98–111)
Creatinine, Ser: 0.8 mg/dL (ref 0.44–1.00)
GFR, Estimated: >60 mL/min (ref >60)
Glucose, Bld: 104 mg/dL — ABNORMAL HIGH (ref 70–99)
Potassium: 3.7 mmol/L (ref 3.5–5.1)
Sodium: 137 mmol/L (ref 135–145)

## 2023-03-23 LAB — D-DIMER, QUANTITATIVE: D-Dimer, Quant: 1.27 ug{FEU}/mL — ABNORMAL HIGH (ref 0.00–0.50)

## 2023-03-23 LAB — CBC WITH DIFFERENTIAL/PLATELET
Abs Immature Granulocytes: 0.02 10*3/uL (ref 0.00–0.07)
Basophils Absolute: 0 10*3/uL (ref 0.0–0.1)
Basophils Relative: 0 %
Eosinophils Absolute: 0.2 10*3/uL (ref 0.0–0.5)
Eosinophils Relative: 3 %
HCT: 37.3 % (ref 36.0–46.0)
Hemoglobin: 12 g/dL (ref 12.0–15.0)
Immature Granulocytes: 0 %
Lymphocytes Relative: 43 %
Lymphs Abs: 3 10*3/uL (ref 0.7–4.0)
MCH: 30.2 pg (ref 26.0–34.0)
MCHC: 32.2 g/dL (ref 30.0–36.0)
MCV: 94 fL (ref 80.0–100.0)
Monocytes Absolute: 0.3 10*3/uL (ref 0.1–1.0)
Monocytes Relative: 5 %
Neutro Abs: 3.3 10*3/uL (ref 1.7–7.7)
Neutrophils Relative %: 49 %
Platelets: 319 10*3/uL (ref 150–400)
RBC: 3.97 MIL/uL (ref 3.87–5.11)
RDW: 12.4 % (ref 11.5–15.5)
WBC: 6.9 10*3/uL (ref 4.0–10.5)
nRBC: 0 % (ref 0.0–0.2)

## 2023-03-23 LAB — RESP PANEL BY RT-PCR (RSV, FLU A&B, COVID)  RVPGX2
Influenza A by PCR: NEGATIVE
Influenza B by PCR: NEGATIVE
Resp Syncytial Virus by PCR: NEGATIVE
SARS Coronavirus 2 by RT PCR: NEGATIVE

## 2023-03-23 MED ORDER — KETOROLAC TROMETHAMINE 30 MG/ML IJ SOLN
30.0000 mg | Freq: Once | INTRAMUSCULAR | Status: AC
  Administered 2023-03-23: 30 mg via INTRAVENOUS
  Filled 2023-03-23: qty 1

## 2023-03-23 MED ORDER — IOHEXOL 350 MG/ML SOLN
75.0000 mL | Freq: Once | INTRAVENOUS | Status: AC | PRN
  Administered 2023-03-23: 75 mL via INTRAVENOUS

## 2023-03-23 MED ORDER — IBUPROFEN 800 MG PO TABS
800.0000 mg | ORAL_TABLET | Freq: Once | ORAL | Status: AC
  Administered 2023-03-23: 800 mg via ORAL
  Filled 2023-03-23: qty 1

## 2023-03-23 NOTE — ED Triage Notes (Signed)
Pt c/o SOB with minimal exertion and headache x 4 days, reports hx of asthma and migraines but feels different

## 2023-03-23 NOTE — ED Notes (Signed)
Pt came to ED stating primary complaint was shortness of breath and fear of a possible clot and a desire for a D-Dimer to be ordered. Pt adamantly expressed this concern to primary nurse, primary nurse relayed information to EDP. EDP ordered D-Dimer, when the lab test came back elevated, EDP ordered Ct angio chest. EDP explained to pt the CT came back clear, there were no signs of a medical emergency and she was cleared to be discharged. After this conversation, the pt stated her primary complaint was her headache and she wanted a head CT. EDP explained there were no indications for this to be a necessary test. During discharge, the pt told the primary nurse "I told my sister if anything happens to my head, to sue the drawers off this place"

## 2023-03-23 NOTE — ED Provider Notes (Signed)
[facility] Provider Note   CSN: [id number] Arrival date & time: 03/23/23  0445     History  Chief Complaint  Patient presents with   Shortness of Breath    Crystal Bradley is a 37 y.o. female.  The history is provided by the patient.  Shortness of Breath Severity:  Moderate Onset quality:  Gradual Duration:  4 days Progression:  Waxing and waning Chronicity:  New Context: not URI   Relieved by:  Nothing Worsened by:  Nothing Ineffective treatments:  None tried Associated symptoms: headaches   Associated symptoms: no chest pain and no fever   Risk factors: oral contraceptive use   Patient with migraines and major depression as well as asthma presents with 4 days of headache and dyspnea.  Patient states the dyspnea is worsening and the head feels like pressure "like when you are screaming at someone."    Past Medical History:  Diagnosis Date   Anemia    Anxiety    Hiatal hernia    Migraine     Home Medications Prior to Admission medications   Medication Sig Start Date End Date Taking? Authorizing Provider  albuterol (VENTOLIN HFA) 108 (90 Base) MCG/ACT inhaler Inhale 1-2 puffs into the lungs every 6 (six) hours as needed for wheezing or shortness of breath. 04/08/22   Margaretann Loveless, PA-C  amphetamine-dextroamphetamine (ADDERALL) 10 MG tablet Take 1 tablet (10 mg total) by mouth 2 (two) times daily. 02/02/22   Wendling, Jilda Roche, DO  clonazePAM (KLONOPIN) 0.5 MG tablet Take 1 tablet (0.5 mg total) by mouth 2 (two) times daily as needed for anxiety. 06/02/22   Sharlene Dory, DO  norgestimate-ethinyl estradiol (SPRINTEC 28) 0.25-35 MG-MCG tablet Take 1 tab daily for 10 days for breakthrough bleeding. 06/02/22   Sharlene Dory, DO  risperiDONE (RISPERDAL) 0.5 MG tablet Take 1 tablet (0.5 mg total) by mouth at bedtime. 06/02/22   Sharlene Dory, DO      Allergies    Latex, Shellfish-derived  products, Ativan [lorazepam], Hydroxyzine, and Latex    Review of Systems   Review of Systems  Constitutional:  Negative for fever.  Respiratory:  Positive for shortness of breath.   Cardiovascular:  Negative for chest pain and leg swelling.  Neurological:  Positive for headaches.  All other systems reviewed and are negative.   Physical Exam Updated Vital Signs BP 107/71   Pulse 90   Temp 98.6 F (37 C) (Oral)   Resp 18   Ht 5\' 10"  (1.778 m)   Wt 97.5 kg   LMP 03/19/2023 (Exact Date)   SpO2 100%   BMI 30.85 kg/m  Physical Exam Vitals and nursing note reviewed.  Constitutional:      General: She is not in acute distress.    Appearance: Normal appearance. She is well-developed.     Comments: Sitting in the room comfortably with all the light on on her phone texting   HENT:     Head: Normocephalic and atraumatic.     Nose: Nose normal.     Mouth/Throat:     Mouth: Mucous membranes are moist.     Pharynx: Oropharynx is clear.  Eyes:     Extraocular Movements: Extraocular movements intact.     Pupils: Pupils are equal, round, and reactive to light.     Comments: Disk margins sharp no proptosis intact cognition   Cardiovascular:     Rate and Rhythm: Normal rate and regular rhythm.  Pulses: Normal pulses.     Heart sounds: Normal heart sounds.  Pulmonary:     Effort: Pulmonary effort is normal. No respiratory distress.     Breath sounds: Normal breath sounds.  Abdominal:     General: Bowel sounds are normal. There is no distension.     Palpations: Abdomen is soft.     Tenderness: There is no abdominal tenderness. There is no guarding or rebound.  Genitourinary:    Vagina: No vaginal discharge.  Musculoskeletal:        General: Normal range of motion.     Cervical back: Normal range of motion and neck supple. No rigidity.  Skin:    General: Skin is warm and dry.     Capillary Refill: Capillary refill takes less than 2 seconds.     Findings: No erythema or rash.   Neurological:     General: No focal deficit present.     Mental Status: She is alert and oriented to person, place, and time.     Cranial Nerves: No cranial nerve deficit.     Gait: Gait normal.     Deep Tendon Reflexes: Reflexes normal.  Psychiatric:        Mood and Affect: Mood is anxious.     ED Results / Procedures / Treatments   Labs (all labs ordered are listed, but only abnormal results are displayed) Results for orders placed or performed during the hospital encounter of 03/23/23  Resp panel by RT-PCR (RSV, Flu A&B, Covid) Anterior Nasal Swab   Specimen: Anterior Nasal Swab  Result Value Ref Range   SARS Coronavirus 2 by RT PCR NEGATIVE NEGATIVE   Influenza A by PCR NEGATIVE NEGATIVE   Influenza B by PCR NEGATIVE NEGATIVE   Resp Syncytial Virus by PCR NEGATIVE NEGATIVE  CBC with Differential  Result Value Ref Range   WBC 6.9 4.0 - 10.5 K/uL   RBC 3.97 3.87 - 5.11 MIL/uL   Hemoglobin 12.0 12.0 - 15.0 g/dL   HCT 16.1 09.6 - 04.5 %   MCV 94.0 80.0 - 100.0 fL   MCH 30.2 26.0 - 34.0 pg   MCHC 32.2 30.0 - 36.0 g/dL   RDW 40.9 81.1 - 91.4 %   Platelets 319 150 - 400 K/uL   nRBC 0.0 0.0 - 0.2 %   Neutrophils Relative % 49 %   Neutro Abs 3.3 1.7 - 7.7 K/uL   Lymphocytes Relative 43 %   Lymphs Abs 3.0 0.7 - 4.0 K/uL   Monocytes Relative 5 %   Monocytes Absolute 0.3 0.1 - 1.0 K/uL   Eosinophils Relative 3 %   Eosinophils Absolute 0.2 0.0 - 0.5 K/uL   Basophils Relative 0 %   Basophils Absolute 0.0 0.0 - 0.1 K/uL   Immature Granulocytes 0 %   Abs Immature Granulocytes 0.02 0.00 - 0.07 K/uL  Basic metabolic panel  Result Value Ref Range   Sodium 137 135 - 145 mmol/L   Potassium 3.7 3.5 - 5.1 mmol/L   Chloride 105 98 - 111 mmol/L   CO2 22 22 - 32 mmol/L   Glucose, Bld 104 (H) 70 - 99 mg/dL   BUN 18 6 - 20 mg/dL   Creatinine, Ser 7.82 0.44 - 1.00 mg/dL   Calcium 9.1 8.9 - 95.6 mg/dL   GFR, Estimated >21 >30 mL/min   Anion gap 10 5 - 15  D-dimer, quantitative   Result Value Ref Range   D-Dimer, Quant 1.27 (H) 0.00 - 0.50 ug/mL-FEU  CT Angio Chest PE W and/or Wo Contrast  Result Date: 03/23/2023 CLINICAL DATA:  37 year old female with history of dyspnea. EXAM: CT ANGIOGRAPHY CHEST WITH CONTRAST TECHNIQUE: Multidetector CT imaging of the chest was performed using the standard protocol during bolus administration of intravenous contrast. Multiplanar CT image reconstructions and MIPs were obtained to evaluate the vascular anatomy. RADIATION DOSE REDUCTION: This exam was performed according to the departmental dose-optimization program which includes automated exposure control, adjustment of the mA and/or kV according to patient size and/or use of iterative reconstruction technique. CONTRAST:  75mL OMNIPAQUE IOHEXOL 350 MG/ML SOLN COMPARISON:  No priors. FINDINGS: Cardiovascular: No filling defects are noted in the pulmonary arterial tree to suggest pulmonary embolism. Heart size is normal. There is no significant pericardial fluid, thickening or pericardial calcification. No atherosclerotic calcifications are noted in the thoracic aorta or the coronary arteries. Mediastinum/Nodes: No pathologically enlarged mediastinal or hilar lymph nodes. Hilar esophagus is unremarkable in appearance. No axillary lymphadenopathy. Lungs/Pleura: No acute consolidative airspace disease. No pleural effusions. No definite suspicious appearing pulmonary nodules or masses are noted. Upper Abdomen: Unremarkable. Musculoskeletal: There are no aggressive appearing lytic or blastic lesions noted in the visualized portions of the skeleton. Review of the MIP images confirms the above findings. IMPRESSION: 1. No acute findings in the thorax to account for the patient's symptoms. Specifically, no evidence of pulmonary embolism. Electronically Signed   By: Trudie Reed M.D.   On: 03/23/2023 06:31   DG Chest Portable 1 View  Result Date: 03/23/2023 CLINICAL DATA:  Asthmatic with shortness of  breath and small hiatal hernia. EXAM: PORTABLE CHEST 1 VIEW COMPARISON:  PA Lat 06/21/2017 FINDINGS: The heart size and mediastinal contours are within normal limits. Both lungs are clear. The visualized skeletal structures are unremarkable. IMPRESSION: No active disease. Electronically Signed   By: Almira Bar M.D.   On: 03/23/2023 06:05     Radiology CT Angio Chest PE W and/or Wo Contrast  Result Date: 03/23/2023 CLINICAL DATA:  37 year old female with history of dyspnea. EXAM: CT ANGIOGRAPHY CHEST WITH CONTRAST TECHNIQUE: Multidetector CT imaging of the chest was performed using the standard protocol during bolus administration of intravenous contrast. Multiplanar CT image reconstructions and MIPs were obtained to evaluate the vascular anatomy. RADIATION DOSE REDUCTION: This exam was performed according to the departmental dose-optimization program which includes automated exposure control, adjustment of the mA and/or kV according to patient size and/or use of iterative reconstruction technique. CONTRAST:  75mL OMNIPAQUE IOHEXOL 350 MG/ML SOLN COMPARISON:  No priors. FINDINGS: Cardiovascular: No filling defects are noted in the pulmonary arterial tree to suggest pulmonary embolism. Heart size is normal. There is no significant pericardial fluid, thickening or pericardial calcification. No atherosclerotic calcifications are noted in the thoracic aorta or the coronary arteries. Mediastinum/Nodes: No pathologically enlarged mediastinal or hilar lymph nodes. Hilar esophagus is unremarkable in appearance. No axillary lymphadenopathy. Lungs/Pleura: No acute consolidative airspace disease. No pleural effusions. No definite suspicious appearing pulmonary nodules or masses are noted. Upper Abdomen: Unremarkable. Musculoskeletal: There are no aggressive appearing lytic or blastic lesions noted in the visualized portions of the skeleton. Review of the MIP images confirms the above findings. IMPRESSION: 1. No acute  findings in the thorax to account for the patient's symptoms. Specifically, no evidence of pulmonary embolism. Electronically Signed   By: Trudie Reed M.D.   On: 03/23/2023 06:31   DG Chest Portable 1 View  Result Date: 03/23/2023 CLINICAL DATA:  Asthmatic with shortness of breath and small hiatal hernia.  EXAM: PORTABLE CHEST 1 VIEW COMPARISON:  PA Lat 06/21/2017 FINDINGS: The heart size and mediastinal contours are within normal limits. Both lungs are clear. The visualized skeletal structures are unremarkable. IMPRESSION: No active disease. Electronically Signed   By: Almira Bar M.D.   On: 03/23/2023 06:05    Procedures Procedures    Medications Ordered in ED Medications  ibuprofen (ADVIL) tablet 800 mg (800 mg Oral Given 03/23/23 0518)  iohexol (OMNIPAQUE) 350 MG/ML injection 75 mL (75 mLs Intravenous Contrast Given 03/23/23 0620)    ED Course/ Medical Decision Making/ A&P                                 Medical Decision Making Patient with asthma and migraine who presents with 4 days   Amount and/or Complexity of Data Reviewed External Data Reviewed: notes.    Details: Previous notes reviewed  Labs: ordered.    Details: Negative covid and flu.  Elevated ddimer 1.27 (order placed for CTA chest).  Sodium normal 137, normal potassium 3.7, normal creatinine.   Radiology: ordered and independent interpretation performed.    Details: Negative CXR by me   Risk Prescription drug management. Risk Details: Patient is well appearing with normal exam.  There are no signs of distress.  Patient is speaking quickly and appears anxious. In order to reassure the patient I had respiratory listen to the patient as well to assure her that she was not wheezing and lungs were clear.  There are no cranial nerve abnormalities.  I do not believe this is meningitis as the patient is well appearing and no fever or stiff neck, the patient would be in.  I do not believe this is an ICH.  No proptosis,  Disk margins are sharp on ocular exam.  I have considered but do not believe this is a cavernous sinus thrombosis.  I started by ordering COVID and CXR as patient was well appearing with normal exam and vitals.  Patient asked for d-dimer as this was her primary concern.  Nurse stated this was her chief concern but she "forgot to tell you when you were in the room that she wanted this." It was ordered immediately with cbc and basic metabolic panel.   I went to speak with her regarding her labs and explained given her symptoms and an elevated d-dimer we would proceed to CTA.  She wanted to know what other things can cause this test to be elevated.  I explained there were many things but that we would rule out pulmonary embolism which is the life threatening cause.  She stated " that is fair."  She agreed to CTA.  Patient went to CTA.  This was normal.  I went to speak with the patient who then stated "what about my head?"  EDP explained that we were treating this and that as the dyspnea was the primary concern this test was ordered. Patient is now stating that I did not understand her and her head was the primary. She is angry and stated "I told you my head was the problem."  This is the opposite of what she said to both myself and nursing.   I do not believe patient needs advanced imaging of the head.  Patient was anxious in the room and I believe a lot of this is to do with anxiety.  She has known migraines.  I did anticipate given the fact that she  asked the nurse for the d-dimer that she was not going to be happy even if the CTA was normal.  I have advised close follow up with PMD.  Nurse reports to me that she was calling family and threatening.      Final Clinical Impression(s) / ED Diagnoses Final diagnoses:  Dyspnea, unspecified type    I have reviewed the triage vital signs and the nursing notes. Pertinent labs & imaging results that were available during my care of the patient were reviewed by me  and considered in my medical decision making (see chart for details). After history, exam, and medical workup I feel the patient has been appropriately medically screened and is safe for discharge home. Pertinent diagnoses were discussed with the patient. Patient was given return precautions.  Rx / DC Orders ED Discharge Orders     None         Aruna Nestler, MD 03/23/23 5621

## 2023-03-23 NOTE — ED Notes (Signed)
Pt expressed desire for D-Dimer, EDP notified

## 2023-03-24 ENCOUNTER — Encounter: Payer: Self-pay | Admitting: Family Medicine

## 2023-03-24 ENCOUNTER — Ambulatory Visit: Payer: MEDICAID | Admitting: Family Medicine

## 2023-03-24 ENCOUNTER — Other Ambulatory Visit (HOSPITAL_COMMUNITY)
Admission: RE | Admit: 2023-03-24 | Discharge: 2023-03-24 | Disposition: A | Discharge status: Home / Self Care | Payer: MEDICAID | Source: Ambulatory Visit | Attending: Family Medicine | Admitting: Family Medicine

## 2023-03-24 VITALS — BP 108/68 | HR 81 | Temp 99.1°F | Ht 70.0 in | Wt 218.4 lb

## 2023-03-24 DIAGNOSIS — Z1159 Encounter for screening for other viral diseases: Secondary | ICD-10-CM

## 2023-03-24 DIAGNOSIS — R519 Headache, unspecified: Secondary | ICD-10-CM | POA: Diagnosis not present

## 2023-03-24 DIAGNOSIS — Z113 Encounter for screening for infections with a predominantly sexual mode of transmission: Secondary | ICD-10-CM | POA: Diagnosis present

## 2023-03-24 DIAGNOSIS — Z0000 Encounter for general adult medical examination without abnormal findings: Secondary | ICD-10-CM

## 2023-03-24 DIAGNOSIS — Z114 Encounter for screening for human immunodeficiency virus [HIV]: Secondary | ICD-10-CM

## 2023-03-24 LAB — COMPREHENSIVE METABOLIC PANEL
ALT: 15 U/L (ref 0–35)
AST: 17 U/L (ref 0–37)
Albumin: 4.3 g/dL (ref 3.5–5.2)
Alkaline Phosphatase: 41 U/L (ref 39–117)
BUN: 13 mg/dL (ref 6–23)
CO2: 24 mEq/L (ref 19–32)
Calcium: 9.5 mg/dL (ref 8.4–10.5)
Chloride: 106 mEq/L (ref 96–112)
Creatinine, Ser: 0.78 mg/dL (ref 0.40–1.20)
GFR: 96.2 mL/min (ref >60.00)
Glucose, Bld: 95 mg/dL (ref 70–99)
Potassium: 4 mEq/L (ref 3.5–5.1)
Sodium: 137 mEq/L (ref 135–145)
Total Bilirubin: 0.5 mg/dL (ref 0.2–1.2)
Total Protein: 7.9 g/dL (ref 6.0–8.3)

## 2023-03-24 LAB — LIPID PANEL
Cholesterol: 191 mg/dL (ref 0–200)
HDL: 40 mg/dL (ref >39.00)
LDL Cholesterol: 131 mg/dL — ABNORMAL HIGH (ref 0–99)
NonHDL: 151.21
Total CHOL/HDL Ratio: 5
Triglycerides: 101 mg/dL (ref 0.0–149.0)
VLDL: 20.2 mg/dL (ref 0.0–40.0)

## 2023-03-24 LAB — CBC
HCT: 37.6 % (ref 36.0–46.0)
Hemoglobin: 12.1 g/dL (ref 12.0–15.0)
MCHC: 32.2 g/dL (ref 30.0–36.0)
MCV: 95.1 fl (ref 78.0–100.0)
Platelets: 328 10*3/uL (ref 150.0–400.0)
RBC: 3.95 Mil/uL (ref 3.87–5.11)
RDW: 13.3 % (ref 11.5–15.5)
WBC: 6.3 10*3/uL (ref 4.0–10.5)

## 2023-03-24 LAB — HIV ANTIBODY (ROUTINE TESTING W REFLEX): HIV 1&2 Ab, 4th Generation: NONREACTIVE

## 2023-03-24 LAB — HEPATITIS C ANTIBODY: Hepatitis C Ab: NONREACTIVE

## 2023-03-24 NOTE — Patient Instructions (Signed)
Give [place] 2-3 business days to get the results of your labs back.   Heat (pad or rice pillow in microwave) over affected area, 10-15 minutes twice daily.   Ice/cold pack over area for 10-15 min twice daily.  Let us know if you need anything.  Trapezius stretches/exercises Do exercises exactly as told by your health care provider and adjust them as directed. It is normal to feel mild stretching, pulling, tightness, or discomfort as you do these exercises, but you should stop right away if you feel sudden pain or your pain gets worse.   Stretching and range of motion exercises These exercises warm up your muscles and joints and improve the movement and flexibility of your shoulder. These exercises can also help to relieve pain, numbness, and tingling. If you are unable to do any of the following for any reason, do not further attempt to do it.   Exercise A: Flexion, standing     Stand and hold a broomstick, a cane, or a similar object. Place your hands a little more than shoulder-width apart on the object. Your left / right hand should be palm-up, and your other hand should be palm-down. Push the stick to raise your left / right arm out to your side and then over your head. Use your other hand to help move the stick. Stop when you feel a stretch in your shoulder, or when you reach the angle that is recommended by your health care provider. Avoid shrugging your shoulder while you raise your arm. Keep your shoulder blade tucked down toward your spine. Hold for 30 seconds. Slowly return to the starting position. Repeat 2 times. Complete this exercise 3 times per week.  Exercise B: Abduction, supine     Lie on your back and hold a broomstick, a cane, or a similar object. Place your hands a little more than shoulder-width apart on the object. Your left / right hand should be palm-up, and your other hand should be palm-down. Push the stick to raise your left / right arm out to your side and then  over your head. Use your other hand to help move the stick. Stop when you feel a stretch in your shoulder, or when you reach the angle that is recommended by your health care provider. Avoid shrugging your shoulder while you raise your arm. Keep your shoulder blade tucked down toward your spine. Hold for 30 seconds. Slowly return to the starting position. Repeat 2 times. Complete this exercise 3 times per week.  Exercise C: Flexion, active-assisted     Lie on your back. You may bend your knees for comfort. Hold a broomstick, a cane, or a similar object. Place your hands about shoulder-width apart on the object. Your palms should face toward your feet. Raise the stick and move your arms over your head and behind your head, toward the floor. Use your healthy arm to help your left / right arm move farther. Stop when you feel a gentle stretch in your shoulder, or when you reach the angle where your health care provider tells you to stop. Hold for 30 seconds. Slowly return to the starting position. Repeat 2 times. Complete this exercise 3 times per week.  Exercise D: External rotation and abduction     Stand in a door frame with one of your feet slightly in front of the other. This is called a staggered stance. Choose one of the following positions as told by your health care provider: Place your hands  and forearms on the door frame above your head. Place your hands and forearms on the door frame at the height of your head. Place your hands on the door frame at the height of your elbows. Slowly move your weight onto your front foot until you feel a stretch across your chest and in the front of your shoulders. Keep your head and chest upright and keep your abdominal muscles tight. Hold for 30 seconds. To release the stretch, shift your weight to your back foot. Repeat 2 times. Complete this stretch 3 times per week.  Strengthening exercises These exercises build strength and endurance in your  shoulder. Endurance is the ability to use your muscles for a long time, even after your muscles get tired. Exercise E: Scapular depression and adduction  Sit on a stable chair. Support your arms in front of you with pillows, armrests, or a tabletop. Keep your elbows in line with the sides of your body. Gently move your shoulder blades down toward your middle back. Relax the muscles on the tops of your shoulders and in the back of your neck. Hold for 3 seconds. Slowly release the tension and relax your muscles completely before doing this exercise again. Repeat for a total of 10 repetitions. After you have practiced this exercise, try doing the exercise without the arm support. Then, try the exercise while standing instead of sitting. Repeat 2 times. Complete this exercise 3 times per week.  Exercise F: Shoulder abduction, isometric     Stand or sit about 4-6 inches (10-15 cm) from a wall with your left / right side facing the wall. Bend your left / right elbow and gently press your elbow against the wall. Increase the pressure slowly until you are pressing as hard as you can without shrugging your shoulder. Hold for 3 seconds. Slowly release the tension and relax your muscles completely. Repeat for a total of 10 repetitions. Repeat 2 times. Complete this exercise 3 times per week.  Exercise G: Shoulder flexion, isometric     Stand or sit about 4-6 inches (10-15 cm) away from a wall with your left / right side facing the wall. Keep your left / right elbow straight and gently press the top of your fist against the wall. Increase the pressure slowly until you are pressing as hard as you can without shrugging your shoulder. Hold for 10-15 seconds. Slowly release the tension and relax your muscles completely. Repeat for a total of 10 repetitions. Repeat 2 times. Complete this exercise 3 times per week.  Exercise H: Internal rotation     Sit in a stable chair without armrests, or stand.  Secure an exercise band at your left / right side, at elbow height. Place a soft object, such as a folded towel or a small pillow, under your left / right upper arm so your elbow is a few inches (about 8 cm) away from your side. Hold the end of the exercise band so the band stretches. Keeping your elbow pressed against the soft object under your arm, move your forearm across your body toward your abdomen. Keep your body steady so the movement is only coming from your shoulder. Hold for 3 seconds. Slowly return to the starting position. Repeat for a total of 10 repetitions. Repeat 2 times. Complete this exercise 3 times per week.  Exercise I: External rotation     Sit in a stable chair without armrests, or stand. Secure an exercise band at your left / right side,  at elbow height. Place a soft object, such as a folded towel or a small pillow, under your left / right upper arm so your elbow is a few inches (about 8 cm) away from your side. Hold the end of the exercise band so the band stretches. Keeping your elbow pressed against the soft object under your arm, move your forearm out, away from your abdomen. Keep your body steady so the movement is only coming from your shoulder. Hold for 3 seconds. Slowly return to the starting position. Repeat for a total of 10 repetitions. Repeat 2 times. Complete this exercise 3 times per week. Exercise J: Shoulder extension  Sit in a stable chair without armrests, or stand. Secure an exercise band to a stable object in front of you so the band is at shoulder height. Hold one end of the exercise band in each hand. Your palms should face each other. Straighten your elbows and lift your hands up to shoulder height. Step back, away from the secured end of the exercise band, until the band stretches. Squeeze your shoulder blades together and pull your hands down to the sides of your thighs. Stop when your hands are straight down by your sides. Do not let your  hands go behind your body. Hold for 3 seconds. Slowly return to the starting position. Repeat for a total of 10 repetitions. Repeat 2 times. Complete this exercise 3 times per week.  Exercise K: Shoulder extension, prone     Lie on your abdomen on a firm surface so your left / right arm hangs over the edge. Hold a 5 lb weight in your hand so your palm faces in toward your body. Your arm should be straight. Squeeze your shoulder blade down toward the middle of your back. Slowly raise your arm behind you, up to the height of the surface that you are lying on. Keep your arm straight. Hold for 3 seconds. Slowly return to the starting position and relax your muscles. Repeat for a total of 10 repetitions. Repeat 2 times. Complete this exercise 3 times per week.   Exercise L: Horizontal abduction, prone  Lie on your abdomen on a firm surface so your left / right arm hangs over the edge. Hold a 5 lb weight in your hand so your palm faces toward your feet. Your arm should be straight. Squeeze your shoulder blade down toward the middle of your back. Bend your elbow so your hand moves up, until your elbow is bent to an "L" shape (90 degrees). With your elbow bent, slowly move your forearm forward and up. Raise your hand up to the height of the surface that you are lying on. Your upper arm should not move, and your elbow should stay bent. At the top of the movement, your palm should face the floor. Hold for 3 seconds. Slowly return to the starting position and relax your muscles. Repeat for a total of 10 repetitions. Repeat 2 times. Complete this exercise 3 times per week.  Exercise M: Horizontal abduction, standing  Sit on a stable chair, or stand. Secure an exercise band to a stable object in front of you so the band is at shoulder height. Hold one end of the exercise band in each hand. Straighten your elbows and lift your hands straight in front of you, up to shoulder height. Your palms should  face down, toward the floor. Step back, away from the secured end of the exercise band, until the band stretches. Move your  arms out to your sides, and keep your arms straight. Hold for 3 seconds. Slowly return to the starting position. Repeat for a total of 10 repetitions. Repeat 2 times. Complete this exercise 3 times per week.  Exercise N: Scapular retraction and elevation  Sit on a stable chair, or stand. Secure an exercise band to a stable object in front of you so the band is at shoulder height. Hold one end of the exercise band in each hand. Your palms should face each other. Sit in a stable chair without armrests, or stand. Step back, away from the secured end of the exercise band, until the band stretches. Squeeze your shoulder blades together and lift your hands over your head. Keep your elbows straight. Hold for 3 seconds. Slowly return to the starting position. Repeat for a total of 10 repetitions. Repeat 2 times. Complete this exercise 3 times per week.  This information is not intended to replace advice given to you by your health care provider. Make sure you discuss any questions you have with your health care provider. Document Released: 07/20/2005 Document Revised: 03/26/2016 Document Reviewed: 06/06/2015 Elsevier Interactive Patient Education  2017 [employer].

## 2023-03-24 NOTE — Progress Notes (Signed)
Chief Complaint  Patient presents with   Follow-up    Hospital follow-up Labs today for CPE    Subjective: Patient is a 37 y.o. female here for f/u.  Starting around 4 days ago, the patient had issues breathing and a headache on both sides of her temples.  She went to the emergency department yesterday.  Chest workup was unremarkable.  Headache is better but still present.  She has been through a lot of stress recently.  No recent trauma.  No neurologic signs or symptoms.  Ibuprofen was not particularly helpful.  Of note, she has not been taking her psychiatric medications.  Past Medical History:  Diagnosis Date   Anemia    Anxiety    Hiatal hernia    Migraine     Objective: BP 108/68 (BP Location: Left Arm, Patient Position: Sitting, Cuff Size: Large)   Pulse 81   Temp 99.1 F (37.3 C) (Oral)   Ht 5\' 10"  (1.778 m)   Wt 218 lb 6 oz (99.1 kg)   LMP 03/19/2023 (Exact Date)   SpO2 99%   BMI 31.33 kg/m  General: Awake, appears stated age MSK: Mild TTP in the suboccipital triangle and cervical paraspinal musculature bilaterally.  Mild TTP over the temporalis musculature bilaterally.  No TMJ TTP. Neuro: DTRs equal and symmetric throughout, no clonus, no cerebellar signs, 5/5 strength throughout Heart: RRR, no LE edema Lungs: CTAB, no rales, wheezes or rhonchi. No accessory muscle use Psych: Age appropriate judgment and insight, normal affect and mood  Assessment and Plan: Nonintractable headache, unspecified chronicity pattern, unspecified headache type  Well adult exam - Plan: CBC, Comprehensive metabolic panel, Lipid panel  Screening for HIV (human immunodeficiency virus) - Plan: HIV Antibody (routine testing w rflx)  Encounter for hepatitis C screening test for low risk patient - Plan: Hepatitis C antibody  Screening examination for STI - Plan: Cervicovaginal ancillary only( [place])  Seems like a tension headache.  Tylenol, heat, ice, stretches and exercises for the  trapezius region.  If no improvement in next month, would consider physical therapy. Check labs for physical which she will schedule. The patient voiced understanding and agreement to the plan.  Jilda Roche [place], DO 03/24/23  8:54 AM

## 2023-03-25 LAB — CERVICOVAGINAL ANCILLARY ONLY
Chlamydia: NEGATIVE
Comment: NEGATIVE
Comment: NEGATIVE
Comment: NORMAL
Neisseria Gonorrhea: NEGATIVE
Trichomonas: NEGATIVE

## 2023-04-28 ENCOUNTER — Ambulatory Visit: Payer: MEDICAID | Admitting: Family Medicine

## 2023-04-28 ENCOUNTER — Encounter: Payer: Self-pay | Admitting: Family Medicine

## 2023-04-28 VITALS — BP 116/74 | HR 84 | Resp 18 | Ht 70.0 in | Wt 219.0 lb

## 2023-04-28 DIAGNOSIS — Z0000 Encounter for general adult medical examination without abnormal findings: Secondary | ICD-10-CM

## 2023-04-28 DIAGNOSIS — Z3009 Encounter for other general counseling and advice on contraception: Secondary | ICD-10-CM

## 2023-04-28 MED ORDER — NORGESTIMATE-ETH ESTRADIOL 0.25-35 MG-MCG PO TABS
ORAL_TABLET | ORAL | 12 refills | Status: DC
Start: 2023-04-28 — End: 2023-10-26

## 2023-04-28 MED ORDER — SERTRALINE HCL 100 MG PO TABS: 100.0000 mg | ORAL_TABLET | Freq: Every day | ORAL | Status: DC

## 2023-04-28 NOTE — Progress Notes (Signed)
Chief Complaint  Patient presents with   Annual Exam     Well Woman Crystal Bradley is here for a complete physical.   Her last physical was >1 year ago.  Current diet: in general, a "healthy" diet. Current exercise: walking, elliptical. Contraception? Yes Fatigue out of ordinary? No Seatbelt? Yes Advanced directive? No  Health Maintenance Pap/HPV- Yes Tetanus- Yes HIV screening- Yes Hep C screening- Yes  Past Medical History:  Diagnosis Date   Anemia    Anxiety    Hiatal hernia    Migraine      Past Surgical History:  Procedure Laterality Date   EYE SURGERY      Medications  Current Outpatient Medications on File Prior to Visit  Medication Sig Dispense Refill   clonazePAM (KLONOPIN) 0.5 MG tablet Take 1 tablet (0.5 mg total) by mouth 2 (two) times daily as needed for anxiety. 30 tablet 1   No current facility-administered medications on file prior to visit.     Allergies Allergies  Allergen Reactions   Latex Swelling   Shellfish-Derived Products Swelling   Ativan [Lorazepam]     Adverse reactions/ agitation   Hydroxyzine     Adverse reactions/agitation   Latex     Review of Systems: Constitutional:  no unexpected weight changes Eye:  no recent significant change in vision Ear/Nose/Mouth/Throat:  Ears:  no tinnitus or vertigo and no recent change in hearing Nose/Mouth/Throat:  no complaints of nasal congestion, no sore throat Cardiovascular: no chest pain Respiratory:  no cough and no shortness of breath Gastrointestinal:  no abdominal pain, no change in bowel habits GU:  Female: negative for dysuria or pelvic pain Musculoskeletal/Extremities:  no pain of the joints Integumentary (Skin/Breast):  no abnormal skin lesions reported Neurologic:  no headaches Endocrine:  denies fatigue Hematologic/Lymphatic:  No areas of easy bleeding  Exam BP 116/74   Pulse 84   Resp 18   Ht 5\' 10"  (1.778 m)   Wt 219 lb (99.3 kg)   SpO2 99%   BMI 31.42 kg/m   General:  well developed, well nourished, in no apparent distress Skin:  no significant moles, warts, or growths Head:  no masses, lesions, or tenderness Eyes:  pupils equal and round, sclera anicteric without injection Ears:  canals without lesions, TMs shiny without retraction, no obvious effusion, no erythema Nose:  nares patent, mucosa normal, and no drainage  Throat/Pharynx:  lips and gingiva without lesion; tongue and uvula midline; non-inflamed pharynx; no exudates or postnasal drainage Neck: neck supple without adenopathy, thyromegaly, or masses Lungs:  clear to auscultation, breath sounds equal bilaterally, no respiratory distress Cardio:  regular rate and rhythm, no bruits, no LE edema Abdomen:  abdomen soft, nontender; bowel sounds normal; no masses or organomegaly Genital: Defer to GYN Musculoskeletal:  symmetrical muscle groups noted without atrophy or deformity Extremities:  no clubbing, cyanosis, or edema, no deformities, no skin discoloration Neuro:  gait normal; deep tendon reflexes normal and symmetric Psych: well oriented with normal range of affect and appropriate judgment/insight  Assessment and Plan  Well adult exam  Birth control counseling - Plan: norgestimate-ethinyl estradiol (SPRINTEC 28) 0.25-35 MG-MCG tablet   Well 37 y.o. female. Counseled on diet and exercise. Struggling w mental health. I think we can help. She will bring me a list of the meds she has been on.  Advanced directive form provided today.  Follow up in 1 mo. The patient voiced understanding and agreement to the plan.  Jilda Roche [place], DO 04/28/23  8:19 AM

## 2023-04-28 NOTE — Patient Instructions (Addendum)
Keep the diet clean and stay active.  Aim to do some physical exertion for 150 minutes per week. This is typically divided into 5 days per week, 30 minutes per day. The activity should be enough to get your heart rate up. Anything is better than nothing if you have time constraints.  Please give me a list of medicines you were on for your mental health. I would like ot start helping you with this.   Please get me a copy of your advanced directive form at your convenience.   Let us know if you need anything.  Please consider counseling. Contact [phone] to schedule an appointment or inquire about cost/insurance coverage.  [facility] located at [address], [place], [place].  Phone number = [phone].  Dr. Regan Lemming - Adult Psychiatry.    [facility] located at [address] [place], [place], [place]. Phone number = [phone].   [facility] located at [address], [place], [place].  Phone number = [phone].   [facility] located at [address] [place], [place], [place].  Phone number = [phone].

## 2023-05-28 ENCOUNTER — Telehealth: Payer: MEDICAID | Admitting: Nurse Practitioner

## 2023-05-28 DIAGNOSIS — T3695XA Adverse effect of unspecified systemic antibiotic, initial encounter: Secondary | ICD-10-CM | POA: Diagnosis not present

## 2023-05-28 DIAGNOSIS — R3989 Other symptoms and signs involving the genitourinary system: Secondary | ICD-10-CM

## 2023-05-28 DIAGNOSIS — B379 Candidiasis, unspecified: Secondary | ICD-10-CM

## 2023-05-28 MED ORDER — CEPHALEXIN 500 MG PO CAPS: 500.0000 mg | ORAL_CAPSULE | Freq: Two times a day (BID) | ORAL | 0 refills | Status: AC

## 2023-05-28 MED ORDER — FLUCONAZOLE 150 MG PO TABS
150.0000 mg | ORAL_TABLET | Freq: Once | ORAL | 0 refills | Status: AC
Start: 2023-05-28 — End: 2023-05-28

## 2023-05-28 NOTE — Addendum Note (Signed)
Addended by: Harlow Mares on: 05/28/2023 05:49 PM   Modules accepted: Orders

## 2023-05-28 NOTE — Progress Notes (Signed)
E-Visit for Urinary Problems  We are sorry that you are not feeling well.  Here is how we plan to help!  Based on what you shared with me it looks like you most likely have a simple urinary tract infection.  A UTI (Urinary Tract Infection) is a bacterial infection of the bladder.  Most cases of urinary tract infections are simple to treat but a key part of your care is to encourage you to drink plenty of fluids and watch your symptoms carefully.  I have prescribed Keflex 500 mg twice a day for 7 days.  Your symptoms should gradually improve. Call us if the burning in your urine worsens, you develop worsening fever, back pain or pelvic pain or if your symptoms do not resolve after completing the antibiotic.  Urinary tract infections can be prevented by drinking plenty of water to keep your body hydrated.  Also be sure when you wipe, wipe from front to back and don't hold it in!  If possible, empty your bladder every 4 hours.  HOME CARE Drink plenty of fluids Compete the full course of the antibiotics even if the symptoms resolve Remember, when you need to go.go. Holding in your urine can increase the likelihood of getting a UTI! GET HELP RIGHT AWAY IF: You cannot urinate You get a high fever Worsening back pain occurs You see blood in your urine You feel sick to your stomach or throw up You feel like you are going to pass out  MAKE SURE YOU  Understand these instructions. Will watch your condition. Will get help right away if you are not doing well or get worse.   Thank you for choosing an e-visit.  Your e-visit answers were reviewed by a board certified advanced clinical practitioner to complete your personal care plan. Depending upon the condition, your plan could have included both over the counter or prescription medications.  Please review your pharmacy choice. Make sure the pharmacy is open so you can pick up prescription now. If there is a problem, you may contact your  provider through MyChart messaging and have the prescription routed to another pharmacy.  Your safety is important to us. If you have drug allergies check your prescription carefully.   For the next 24 hours you can use MyChart to ask questions about today's visit, request a non-urgent call back, or ask for a work or school excuse. You will get an email in the next two days asking about your experience. I hope that your e-visit has been valuable and will speed your recovery.   Meds ordered this encounter  Medications   cephALEXin (KEFLEX) 500 MG capsule    Sig: Take 1 capsule (500 mg total) by mouth 2 (two) times daily for 7 days.    Dispense:  14 capsule    Refill:  0    I spent approximately 5 minutes reviewing the patient's history, current symptoms and coordinating their care today.   

## 2023-06-11 ENCOUNTER — Ambulatory Visit (INDEPENDENT_AMBULATORY_CARE_PROVIDER_SITE_OTHER): Payer: MEDICAID | Admitting: Family Medicine

## 2023-06-11 ENCOUNTER — Encounter: Payer: Self-pay | Admitting: Family Medicine

## 2023-06-11 VITALS — BP 124/76 | HR 74 | Temp 98.0°F | Resp 16 | Ht 70.0 in | Wt 219.0 lb

## 2023-06-11 DIAGNOSIS — F323 Major depressive disorder, single episode, severe with psychotic features: Secondary | ICD-10-CM | POA: Diagnosis not present

## 2023-06-11 DIAGNOSIS — F411 Generalized anxiety disorder: Secondary | ICD-10-CM | POA: Diagnosis not present

## 2023-06-11 MED ORDER — PROPRANOLOL HCL 10 MG PO TABS
ORAL_TABLET | ORAL | 1 refills | Status: DC
Start: 2023-06-11 — End: 2023-10-26

## 2023-06-11 MED ORDER — SERTRALINE HCL 100 MG PO TABS
50.0000 mg | ORAL_TABLET | Freq: Every day | ORAL | Status: DC
Start: 2023-06-11 — End: 2023-07-29

## 2023-06-11 MED ORDER — SERTRALINE HCL 25 MG PO TABS: 25.0000 mg | ORAL_TABLET | Freq: Every day | ORAL | 0 refills | Status: DC

## 2023-06-11 MED ORDER — CLONAZEPAM 1 MG PO TABS: 1.0000 mg | ORAL_TABLET | Freq: Two times a day (BID) | ORAL | 1 refills | Status: DC | PRN

## 2023-06-11 NOTE — Progress Notes (Signed)
Chief Complaint  Patient presents with   Medication Refill    Medication check    Subjective Crystal Bradley presents for f/u anxiety/depression.  Pt is currently being treated with Zoloft 100 mg/d, Klonopin 0.5 mg qid.  Reports struggling since treatment. Having worsening mood swings and GI s/s's since starting.  No thoughts of harming self or others. No self-medication with alcohol, prescription drugs or illicit drugs. Pt is following with a counselor/psychologist.  Past Medical History:  Diagnosis Date   Anemia    Anxiety    Hiatal hernia    Migraine    Allergies as of 06/11/2023       Reactions   Latex Swelling   Shellfish-derived Products Swelling   Ativan [lorazepam]    Adverse reactions/ agitation   Hydroxyzine    Adverse reactions/agitation   Latex         Medication List        Accurate as of June 11, 2023  4:38 PM. If you have any questions, ask your nurse or doctor.          clonazePAM 1 MG tablet Commonly known as: KLONOPIN Take 1 tablet (1 mg total) by mouth 2 (two) times daily as needed for anxiety. What changed:  medication strength how much to take Changed by: Sharlene Dory   norgestimate-ethinyl estradiol 0.25-35 MG-MCG tablet Commonly known as: Sprintec 28 Take 1 tab daily for 10 days for breakthrough bleeding.   propranolol 10 MG tablet Commonly known as: INDERAL Take 1 tab by mouth 3 times daily as needed for anxiety. Started by: Sharlene Dory   sertraline 100 MG tablet Commonly known as: ZOLOFT Take 0.5 tablets (50 mg total) by mouth daily. What changed: how much to take Changed by: Sharlene Dory   sertraline 25 MG tablet Commonly known as: ZOLOFT Take 1 tablet (25 mg total) by mouth daily. Start taking on: July 11, 2023 What changed: You were already taking a medication with the same name, and this prescription was added. Make sure you understand how and when to take each. Changed by:  Sharlene Dory        Exam BP 124/76 (BP Location: Left Arm, Patient Position: Sitting, Cuff Size: Normal)   Pulse 74   Temp 98 F (36.7 C) (Oral)   Resp 16   Ht 5\' 10"  (1.778 m)   Wt 219 lb (99.3 kg)   SpO2 97%   BMI 31.42 kg/m  General:  well developed, well nourished, in no apparent distress Lungs:  No respiratory distress Psych: well oriented with normal range of affect and age-appropriate judgement/insight, alert and oriented x4.  Assessment and Plan  Severe major depression with psychotic features, mood-congruent (HCC) - Plan: sertraline (ZOLOFT) 100 MG tablet, sertraline (ZOLOFT) 25 MG tablet, Ambulatory referral to Psychiatry  GAD (generalized anxiety disorder) - Plan: clonazePAM (KLONOPIN) 1 MG tablet, propranolol (INDERAL) 10 MG tablet, Ambulatory referral to Psychiatry  Chronic, unstable. Tough for her to get into [place] psych (4-6 mo at a time unless having a crisis). Refer to [facility]. Start to wean down on Zoloft to 50 mg/d for a mo and then 25 mg/d. Cont w counseling. Counseled on exercise. Needs to get outside. Increase Klonopin to 1 mg BID prn from 0.5 mg QID. Add propranolol as alt prn med. F/u in 6 weeks. Will add Vraylar if not ideal then.  The patient voiced understanding and agreement to the plan.  Jilda Roche [place], DO 06/11/23 4:38 PM

## 2023-06-11 NOTE — Patient Instructions (Addendum)
Aim to do some physical exertion for 150 minutes per week. This is typically divided into 5 days per week, 30 minutes per day. The activity should be enough to get your heart rate up. Anything is better than nothing if you have time constraints.  Please try to get outside.   If you do not hear anything about your referral in the next 1-2 weeks, call our office and ask for an update.  Let me know if there are issues in the coming weeks.   Let us know if you need anything.

## 2023-06-22 ENCOUNTER — Encounter: Payer: Self-pay | Admitting: Family Medicine

## 2023-07-23 ENCOUNTER — Ambulatory Visit: Payer: MEDICAID | Admitting: Family Medicine

## 2023-07-29 ENCOUNTER — Telehealth: Payer: MEDICAID | Admitting: Physician Assistant

## 2023-07-29 DIAGNOSIS — B9689 Other specified bacterial agents as the cause of diseases classified elsewhere: Secondary | ICD-10-CM

## 2023-07-29 DIAGNOSIS — N760 Acute vaginitis: Secondary | ICD-10-CM

## 2023-07-29 MED ORDER — METRONIDAZOLE 500 MG PO TABS
500.0000 mg | ORAL_TABLET | Freq: Two times a day (BID) | ORAL | 0 refills | Status: DC
Start: 2023-07-29 — End: 2023-07-29

## 2023-07-29 MED ORDER — FLUCONAZOLE 150 MG PO TABS: 150.0000 mg | ORAL_TABLET | Freq: Once | ORAL | 0 refills | Status: AC

## 2023-07-29 MED ORDER — METRONIDAZOLE 0.75 % VA GEL
1.0000 | Freq: Every day | VAGINAL | 0 refills | Status: AC
Start: 2023-07-29 — End: 2023-08-05

## 2023-07-29 NOTE — Progress Notes (Signed)
E-Visit for Vaginal Symptoms  We are sorry that you are not feeling well. Here is how we plan to help! Based on what you shared with me it looks like you: May have a vaginosis due to bacteria  Vaginosis is an inflammation of the vagina that can result in discharge, itching and pain. The cause is usually a change in the normal balance of vaginal bacteria or an infection. Vaginosis can also result from reduced estrogen levels after menopause.  The most common causes of vaginosis are:   Bacterial vaginosis which results from an overgrowth of one on several organisms that are normally present in your vagina.   Yeast infections which are caused by a naturally occurring fungus called candida.   Vaginal atrophy (atrophic vaginosis) which results from the thinning of the vagina from reduced estrogen levels after menopause.   Trichomoniasis which is caused by a parasite and is commonly transmitted by sexual intercourse.  Factors that increase your risk of developing vaginosis include: Medications, such as antibiotics and steroids Uncontrolled diabetes Use of hygiene products such as bubble bath, vaginal spray or vaginal deodorant Douching Wearing damp or tight-fitting clothing Using an intrauterine device (IUD) for birth control Hormonal changes, such as those associated with pregnancy, birth control pills or menopause Sexual activity Having a sexually transmitted infection  Your treatment plan is Metronidazole or Flagyl 500mg twice a day for 7 days.  I have electronically sent this prescription into the pharmacy that you have chosen.  Be sure to take all of the medication as directed. Stop taking any medication if you develop a rash, tongue swelling or shortness of breath. Mothers who are breast feeding should consider pumping and discarding their breast milk while on these antibiotics. However, there is no consensus that infant exposure at these doses would be harmful.  Remember that  medication creams can weaken latex condoms. .   HOME CARE:  Good hygiene may prevent some types of vaginosis from recurring and may relieve some symptoms:  Avoid baths, hot tubs and whirlpool spas. Rinse soap from your outer genital area after a shower, and dry the area well to prevent irritation. Don't use scented or harsh soaps, such as those with deodorant or antibacterial action. Avoid irritants. These include scented tampons and pads. Wipe from front to back after using the toilet. Doing so avoids spreading fecal bacteria to your vagina.  Other things that may help prevent vaginosis include:  Don't douche. Your vagina doesn't require cleansing other than normal bathing. Repetitive douching disrupts the normal organisms that reside in the vagina and can actually increase your risk of vaginal infection. Douching won't clear up a vaginal infection. Use a latex condom. Both female and female latex condoms may help you avoid infections spread by sexual contact. Wear cotton underwear. Also wear pantyhose with a cotton crotch. If you feel comfortable without it, skip wearing underwear to bed. Yeast thrives in moist environments Your symptoms should improve in the next day or two.  GET HELP RIGHT AWAY IF:  You have pain in your lower abdomen ( pelvic area or over your ovaries) You develop nausea or vomiting You develop a fever Your discharge changes or worsens You have persistent pain with intercourse You develop shortness of breath, a rapid pulse, or you faint.  These symptoms could be signs of problems or infections that need to be evaluated by a medical provider now.  MAKE SURE YOU   Understand these instructions. Will watch your condition. Will get help right   away if you are not doing well or get worse.  Thank you for choosing an e-visit.  Your e-visit answers were reviewed by a board certified advanced clinical practitioner to complete your personal care plan. Depending upon the  condition, your plan could have included both over the counter or prescription medications.  Please review your pharmacy choice. Make sure the pharmacy is open so you can pick up prescription now. If there is a problem, you may contact your provider through MyChart messaging and have the prescription routed to another pharmacy.  Your safety is important to us. If you have drug allergies check your prescription carefully.   For the next 24 hours you can use MyChart to ask questions about today's visit, request a non-urgent call back, or ask for a work or school excuse. You will get an email in the next two days asking about your experience. I hope that your e-visit has been valuable and will speed your recovery.  I have spent 5 minutes in review of e-visit questionnaire, review and updating patient chart, medical decision making and response to patient.   Siddalee Vanderheiden M Lujean Ebright, PA-C  

## 2023-07-29 NOTE — Addendum Note (Signed)
Addended by: Margaretann Loveless on: 07/29/2023 12:02 PM   Modules accepted: Orders

## 2023-08-10 ENCOUNTER — Encounter (HOSPITAL_COMMUNITY): Payer: Self-pay

## 2023-08-12 ENCOUNTER — Ambulatory Visit (HOSPITAL_COMMUNITY): Payer: MEDICAID | Admitting: Student

## 2023-09-06 ENCOUNTER — Encounter (HOSPITAL_COMMUNITY): Payer: Self-pay

## 2023-09-06 ENCOUNTER — Ambulatory Visit (HOSPITAL_COMMUNITY): Payer: MEDICAID | Admitting: Mental Health

## 2023-09-22 ENCOUNTER — Ambulatory Visit: Payer: MEDICAID | Admitting: Family Medicine

## 2023-09-28 ENCOUNTER — Ambulatory Visit (HOSPITAL_COMMUNITY): Payer: MEDICAID | Admitting: Student

## 2023-10-26 ENCOUNTER — Other Ambulatory Visit (HOSPITAL_COMMUNITY)
Admission: RE | Admit: 2023-10-26 | Discharge: 2023-10-26 | Disposition: A | Discharge status: Home / Self Care | Payer: MEDICAID | Source: Ambulatory Visit | Attending: Family Medicine | Admitting: Family Medicine

## 2023-10-26 ENCOUNTER — Ambulatory Visit (INDEPENDENT_AMBULATORY_CARE_PROVIDER_SITE_OTHER): Payer: MEDICAID | Admitting: Family Medicine

## 2023-10-26 ENCOUNTER — Encounter: Payer: Self-pay | Admitting: Family Medicine

## 2023-10-26 VITALS — BP 122/72 | HR 70 | Ht 70.0 in | Wt 206.6 lb

## 2023-10-26 DIAGNOSIS — Z114 Encounter for screening for human immunodeficiency virus [HIV]: Secondary | ICD-10-CM | POA: Diagnosis not present

## 2023-10-26 DIAGNOSIS — F411 Generalized anxiety disorder: Secondary | ICD-10-CM | POA: Diagnosis not present

## 2023-10-26 DIAGNOSIS — F4310 Post-traumatic stress disorder, unspecified: Secondary | ICD-10-CM

## 2023-10-26 DIAGNOSIS — Z113 Encounter for screening for infections with a predominantly sexual mode of transmission: Secondary | ICD-10-CM | POA: Diagnosis not present

## 2023-10-26 MED ORDER — CLONAZEPAM 1 MG PO TABS
1.0000 mg | ORAL_TABLET | Freq: Two times a day (BID) | ORAL | 1 refills | Status: DC | PRN
Start: 2023-10-26 — End: 2024-06-06

## 2023-10-26 MED ORDER — PROPRANOLOL HCL 20 MG PO TABS: 20.0000 mg | ORAL_TABLET | Freq: Three times a day (TID) | ORAL | 3 refills | Status: DC

## 2023-10-26 NOTE — Patient Instructions (Signed)
 Give [place] 2-3 business days to get the results of your labs back.   Keep the diet clean and stay active.  Let us know if you need anything.

## 2023-10-26 NOTE — Progress Notes (Signed)
 Chief Complaint  Patient presents with   Annual Exam    Patient presents today for a physical exam    Subjective [place] presents for f/u anxiety.  Pt is currently being treated with propranolol 10 mg 3 times daily as needed and Klonopin 1 mg twice daily as needed which he uses for breakthrough anxiety.  Reports doing okay since treatment. She has been to several psychiatrist through the [place] which has not gone well. Pt is not following with a counselor/psychologist.  She is coming off of her relationship where she was cheated on and would like to be screened for STIs.  She is not having any symptoms.  Past Medical History:  Diagnosis Date   Anemia    Anxiety    Hiatal hernia    Migraine    Allergies as of 10/26/2023       Reactions   Latex Swelling   Shellfish-derived Products Swelling   Ativan [lorazepam]    Adverse reactions/ agitation   Hydroxyzine    Adverse reactions/agitation   Latex         Medication List        Accurate as of October 26, 2023  4:31 PM. If you have any questions, ask your nurse or doctor.          STOP taking these medications    norgestimate-ethinyl estradiol 0.25-35 MG-MCG tablet Commonly known as: Sprintec 28 Stopped by: Crystal Roche [place]       TAKE these medications    clonazePAM 1 MG tablet Commonly known as: KLONOPIN Take 1 tablet (1 mg total) by mouth 2 (two) times daily as needed for anxiety.   propranolol 20 MG tablet Commonly known as: INDERAL Take 1 tablet (20 mg total) by mouth 3 (three) times daily. What changed:  medication strength how much to take how to take this when to take this additional instructions Changed by: [place]        Exam BP 122/72   Pulse 70   Ht 5\' 10"  (1.778 m)   Wt 206 lb 9.6 oz (93.7 kg)   LMP 10/10/2023 (Exact Date)   SpO2 98%   BMI 29.64 kg/m  General:  well developed, well nourished, in no apparent distress Heart: RRR Lungs: Clear to  auscultation bilaterally.  No respiratory distress Psych: well oriented with normal range of affect and age-appropriate judgement/insight, alert and oriented x4.  Assessment and Plan  GAD (generalized anxiety disorder) - Plan: clonazePAM (KLONOPIN) 1 MG tablet  Screening examination for STI - Plan: Cervicovaginal ancillary only( [place])  Screening for HIV without presence of risk factors - Plan: HIV Antibody (routine testing w rflx)  PTSD (post-traumatic stress disorder) - Plan: DRUG MONITORING, PANEL 8 WITH CONFIRMATION, URINE  Chronic, not controlled.  Increase propranolol from 10 mg 3 times daily to 20 mg 3 times daily.  Klonopin for breakthrough.  She is possibly moving to [place].  She will set up with the behavioral health team out there. Screening as above.  Practice safe sex. Screen. Routine monitoring. The patient voiced understanding and agreement to the plan.  Crystal Roche [place], DO 10/26/23 4:31 PM

## 2023-10-27 ENCOUNTER — Other Ambulatory Visit: Payer: Self-pay | Admitting: Family Medicine

## 2023-10-27 LAB — CERVICOVAGINAL ANCILLARY ONLY
Chlamydia: NEGATIVE
Comment: NEGATIVE
Comment: NEGATIVE
Comment: NORMAL
Neisseria Gonorrhea: NEGATIVE
Trichomonas: NEGATIVE

## 2023-10-27 MED ORDER — AMPHETAMINE-DEXTROAMPHETAMINE 10 MG PO TABS: 10.0000 mg | ORAL_TABLET | Freq: Two times a day (BID) | ORAL | 0 refills | Status: DC

## 2023-10-28 ENCOUNTER — Encounter: Payer: Self-pay | Admitting: Family Medicine

## 2023-10-28 LAB — DRUG MONITORING, PANEL 8 WITH CONFIRMATION, URINE
6 Acetylmorphine: NEGATIVE ng/mL (ref <10)
Alcohol Metabolites: NEGATIVE ng/mL (ref <500)
Alphahydroxyalprazolam: NEGATIVE ng/mL (ref <25)
Alphahydroxymidazolam: NEGATIVE ng/mL (ref <50)
Alphahydroxytriazolam: NEGATIVE ng/mL (ref <50)
Aminoclonazepam: 1101 ng/mL — ABNORMAL HIGH (ref <25)
Amphetamine: 12297 ng/mL — ABNORMAL HIGH (ref <250)
Amphetamines: POSITIVE ng/mL — AB (ref <500)
Benzodiazepines: POSITIVE ng/mL — AB (ref <100)
Buprenorphine, Urine: NEGATIVE ng/mL (ref <5)
Cocaine Metabolite: NEGATIVE ng/mL (ref <150)
Creatinine: >300 mg/dL (ref >=20.0)
Hydroxyethylflurazepam: NEGATIVE ng/mL (ref <50)
Lorazepam: NEGATIVE ng/mL (ref <50)
MDMA: NEGATIVE ng/mL (ref <500)
Marijuana Metabolite: 5 ng/mL — ABNORMAL HIGH (ref <5)
Marijuana Metabolite: POSITIVE ng/mL — AB (ref <20)
Methamphetamine: NEGATIVE ng/mL (ref <250)
Nordiazepam: NEGATIVE ng/mL (ref <50)
Opiates: NEGATIVE ng/mL (ref <100)
Oxazepam: NEGATIVE ng/mL (ref <50)
Oxidant: NEGATIVE ug/mL (ref <200)
Oxycodone: NEGATIVE ng/mL (ref <100)
Temazepam: NEGATIVE ng/mL (ref <50)
pH: 5.5 (ref 4.5–9.0)

## 2023-10-28 LAB — DM TEMPLATE

## 2023-10-28 LAB — HIV ANTIBODY (ROUTINE TESTING W REFLEX): HIV 1&2 Ab, 4th Generation: NONREACTIVE

## 2023-10-28 MED ORDER — NORGESTIMATE-ETH ESTRADIOL 0.25-35 MG-MCG PO TABS: 1.0000 | ORAL_TABLET | Freq: Every day | ORAL | 11 refills | Status: DC

## 2023-10-29 ENCOUNTER — Encounter (HOSPITAL_COMMUNITY): Payer: Self-pay

## 2023-10-29 ENCOUNTER — Ambulatory Visit (HOSPITAL_COMMUNITY): Payer: MEDICAID | Admitting: Student

## 2023-11-11 ENCOUNTER — Encounter: Payer: Self-pay | Admitting: Family Medicine

## 2023-11-12 ENCOUNTER — Other Ambulatory Visit: Payer: Self-pay | Admitting: Family Medicine

## 2023-11-12 MED ORDER — NORETHINDRONE ACET-ETHINYL EST 1-20 MG-MCG PO TABS: 1.0000 | ORAL_TABLET | Freq: Every day | ORAL | 11 refills | Status: DC

## 2024-04-28 ENCOUNTER — Encounter: Payer: MEDICAID | Admitting: Family Medicine

## 2024-05-19 ENCOUNTER — Other Ambulatory Visit: Payer: Self-pay | Admitting: Physician Assistant

## 2024-05-19 DIAGNOSIS — Z1231 Encounter for screening mammogram for malignant neoplasm of breast: Secondary | ICD-10-CM

## 2024-06-06 ENCOUNTER — Encounter: Payer: Self-pay | Admitting: Family Medicine

## 2024-06-06 ENCOUNTER — Ambulatory Visit (INDEPENDENT_AMBULATORY_CARE_PROVIDER_SITE_OTHER): Payer: MEDICAID | Admitting: Family Medicine

## 2024-06-06 VITALS — BP 128/72 | HR 100 | Temp 98.0°F | Resp 16 | Ht 70.0 in | Wt 221.0 lb

## 2024-06-06 DIAGNOSIS — Z3201 Encounter for pregnancy test, result positive: Secondary | ICD-10-CM | POA: Diagnosis not present

## 2024-06-06 DIAGNOSIS — F411 Generalized anxiety disorder: Secondary | ICD-10-CM

## 2024-06-06 MED ORDER — SERTRALINE HCL 25 MG PO TABS
25.0000 mg | ORAL_TABLET | Freq: Every day | ORAL | 3 refills | Status: AC
Start: 2024-06-06 — End: ?

## 2024-06-06 MED ORDER — ONDANSETRON 4 MG PO TBDP: 4.0000 mg | ORAL_TABLET | Freq: Three times a day (TID) | ORAL | 0 refills | Status: AC | PRN

## 2024-06-06 NOTE — Progress Notes (Signed)
 Chief Complaint  Patient presents with   Routine Prenatal Visit    Pregnant     Subjective: Patient is a 38 y.o. female here for f/u.  Patient had 2 home pregnancy test.  She believes she conceived at the end of September.  She has stopped all of her medications.  She is taking a prenatal vitamin.  She does not currently have an OB/GYN.  Stress/anxiety has been very high.  She has been having some nausea.  No bleeding.  Past Medical History:  Diagnosis Date   Anemia    Anxiety    Hiatal hernia    Migraine     Objective: BP 128/72 (BP Location: Left Arm, Patient Position: Sitting)   Pulse 100   Temp 98 F (36.7 C) (Oral)   Resp 16   Ht 5' 10 (1.778 m)   Wt 221 lb (100.2 kg)   SpO2 97%   BMI 31.71 kg/m  General: Awake, appears stated age Lungs: No accessory muscle use Psych: Age appropriate judgment and insight, normal affect and mood  Assessment and Plan: Positive pregnancy test - Plan: hCG, quantitative, pregnancy, Ambulatory referral to Obstetrics / Gynecology, hCG, quantitative, pregnancy  GAD (generalized anxiety disorder) - Plan: sertraline  (ZOLOFT ) 25 MG tablet  Frustrating situation for the patient.  She was not planning on having a child and is not certain what she wishes to do.  We talked about [facility] versus looking to adoption versus carrying the child to term and raising on her own.  Will send in Zofran  as needed for nausea.  Unisom and vitamin B6 also recommended.  Continue prenatal vitamin.  Referral to OB GYN. Chronic, not controlled.  Start Zoloft  25 mg daily.  Follow-up in 6 weeks to recheck. The patient voiced understanding and agreement to the plan.  Mabel Mt Bieber, DO 06/06/24  4:33 PM

## 2024-06-06 NOTE — Patient Instructions (Signed)
 Consider B6 and Unisom for nausea.  Take a prenatal vitamin.   Let us  know if you need anything.

## 2024-06-07 LAB — HCG, QUANTITATIVE, PREGNANCY: Quantitative HCG: 199.76 m[IU]/mL

## 2024-06-08 ENCOUNTER — Ambulatory Visit: Payer: Self-pay | Admitting: Family Medicine

## 2024-07-13 ENCOUNTER — Ambulatory Visit (INDEPENDENT_AMBULATORY_CARE_PROVIDER_SITE_OTHER): Payer: MEDICAID | Admitting: *Deleted

## 2024-07-13 VITALS — BP 120/79 | HR 90 | Wt 226.5 lb

## 2024-07-13 DIAGNOSIS — O0991 Supervision of high risk pregnancy, unspecified, first trimester: Secondary | ICD-10-CM

## 2024-07-13 DIAGNOSIS — Z3A09 9 weeks gestation of pregnancy: Secondary | ICD-10-CM

## 2024-07-13 NOTE — Progress Notes (Addendum)
 Pt became very agitated during intake interview. Did not want to answer questions. When questions were answered her tone was aggressive. Provider attempted to deescalate the pt. Pt got up and left Intake abruptly during pregnancy risk screening questions.

## 2024-08-04 ENCOUNTER — Ambulatory Visit: Payer: MEDICAID
